# Patient Record
Sex: Female | Born: 1970
Health system: Southern US, Community
[De-identification: ages and names within clinical notes are randomized; demographics above are authoritative.]

## PROBLEM LIST (undated history)

## (undated) DIAGNOSIS — R011 Cardiac murmur, unspecified: Secondary | ICD-10-CM

## (undated) DIAGNOSIS — M47816 Spondylosis without myelopathy or radiculopathy, lumbar region: Secondary | ICD-10-CM

## (undated) DIAGNOSIS — K317 Polyp of stomach and duodenum: Secondary | ICD-10-CM

## (undated) DIAGNOSIS — M519 Unspecified thoracic, thoracolumbar and lumbosacral intervertebral disc disorder: Secondary | ICD-10-CM

## (undated) DIAGNOSIS — T7840XA Allergy, unspecified, initial encounter: Secondary | ICD-10-CM

## (undated) DIAGNOSIS — Q211 Atrial septal defect: Secondary | ICD-10-CM

## (undated) DIAGNOSIS — Q2112 Patent foramen ovale: Secondary | ICD-10-CM

## (undated) DIAGNOSIS — M5104 Intervertebral disc disorders with myelopathy, thoracic region: Secondary | ICD-10-CM

## (undated) DIAGNOSIS — K449 Diaphragmatic hernia without obstruction or gangrene: Secondary | ICD-10-CM

## (undated) DIAGNOSIS — K219 Gastro-esophageal reflux disease without esophagitis: Secondary | ICD-10-CM

## (undated) DIAGNOSIS — K2270 Barrett's esophagus without dysplasia: Secondary | ICD-10-CM

## (undated) HISTORY — PX: MICRODISCECTOMY LUMBAR: SUR864

## (undated) HISTORY — DX: Barrett's esophagus without dysplasia: K22.70

## (undated) HISTORY — DX: Gastro-esophageal reflux disease without esophagitis: K21.9

## (undated) HISTORY — PX: COLONOSCOPY: SHX174

## (undated) HISTORY — PX: TUBAL LIGATION: SHX77

## (undated) HISTORY — DX: Polyp of stomach and duodenum: K31.7

## (undated) HISTORY — DX: Allergy, unspecified, initial encounter: T78.40XA

## (undated) HISTORY — DX: Unspecified thoracic, thoracolumbar and lumbosacral intervertebral disc disorder: M51.9

## (undated) HISTORY — DX: Diaphragmatic hernia without obstruction or gangrene: K44.9

## (undated) HISTORY — DX: Atrial septal defect: Q21.1

## (undated) HISTORY — PX: KNEE ARTHROSCOPY: SUR90

## (undated) HISTORY — DX: Cardiac murmur, unspecified: R01.1

## (undated) HISTORY — DX: Spondylosis without myelopathy or radiculopathy, lumbar region: M47.816

## (undated) HISTORY — PX: UPPER GASTROINTESTINAL ENDOSCOPY: SHX188

## (undated) HISTORY — DX: Patent foramen ovale: Q21.12

---

## 1998-01-10 ENCOUNTER — Other Ambulatory Visit: Admission: RE | Admit: 1998-01-10 | Discharge: 1998-01-10 | Payer: Self-pay | Admitting: Obstetrics and Gynecology

## 1998-07-10 ENCOUNTER — Other Ambulatory Visit: Admission: RE | Admit: 1998-07-10 | Discharge: 1998-07-10 | Payer: Self-pay | Admitting: Obstetrics and Gynecology

## 1999-06-30 ENCOUNTER — Encounter: Payer: Self-pay | Admitting: Family Medicine

## 1999-06-30 ENCOUNTER — Ambulatory Visit (HOSPITAL_COMMUNITY): Admission: RE | Admit: 1999-06-30 | Discharge: 1999-06-30 | Payer: Self-pay | Admitting: Family Medicine

## 1999-07-16 ENCOUNTER — Other Ambulatory Visit: Admission: RE | Admit: 1999-07-16 | Discharge: 1999-07-16 | Payer: Self-pay | Admitting: *Deleted

## 2000-07-27 ENCOUNTER — Other Ambulatory Visit: Admission: RE | Admit: 2000-07-27 | Discharge: 2000-07-27 | Payer: Self-pay | Admitting: *Deleted

## 2001-08-03 ENCOUNTER — Other Ambulatory Visit: Admission: RE | Admit: 2001-08-03 | Discharge: 2001-08-03 | Payer: Self-pay | Admitting: Obstetrics and Gynecology

## 2002-08-15 ENCOUNTER — Other Ambulatory Visit: Admission: RE | Admit: 2002-08-15 | Discharge: 2002-08-15 | Payer: Self-pay | Admitting: Obstetrics and Gynecology

## 2003-07-10 ENCOUNTER — Encounter: Admission: RE | Admit: 2003-07-10 | Discharge: 2003-07-10 | Payer: Self-pay | Admitting: Family Medicine

## 2003-07-24 ENCOUNTER — Encounter: Admission: RE | Admit: 2003-07-24 | Discharge: 2003-07-24 | Payer: Self-pay | Admitting: Family Medicine

## 2003-08-21 ENCOUNTER — Encounter: Admission: RE | Admit: 2003-08-21 | Discharge: 2003-08-21 | Payer: Self-pay | Admitting: Family Medicine

## 2003-08-28 ENCOUNTER — Other Ambulatory Visit: Admission: RE | Admit: 2003-08-28 | Discharge: 2003-08-28 | Payer: Self-pay | Admitting: Obstetrics and Gynecology

## 2003-09-01 HISTORY — PX: HEMILAMINOTOMY LUMBAR SPINE: SUR654

## 2003-09-16 ENCOUNTER — Ambulatory Visit (HOSPITAL_COMMUNITY): Admission: RE | Admit: 2003-09-16 | Discharge: 2003-09-17 | Payer: Self-pay | Admitting: Neurosurgery

## 2005-11-25 ENCOUNTER — Inpatient Hospital Stay (HOSPITAL_COMMUNITY): Admission: AD | Admit: 2005-11-25 | Discharge: 2005-11-28 | Payer: Self-pay | Admitting: Obstetrics and Gynecology

## 2005-11-25 ENCOUNTER — Encounter (INDEPENDENT_AMBULATORY_CARE_PROVIDER_SITE_OTHER): Payer: Self-pay | Admitting: Specialist

## 2005-12-02 ENCOUNTER — Ambulatory Visit: Admission: RE | Admit: 2005-12-02 | Discharge: 2005-12-02 | Payer: Self-pay | Admitting: Obstetrics and Gynecology

## 2007-10-24 ENCOUNTER — Encounter (INDEPENDENT_AMBULATORY_CARE_PROVIDER_SITE_OTHER): Payer: Self-pay | Admitting: Obstetrics and Gynecology

## 2007-10-24 ENCOUNTER — Inpatient Hospital Stay (HOSPITAL_COMMUNITY): Admission: AD | Admit: 2007-10-24 | Discharge: 2007-10-26 | Payer: Self-pay | Admitting: Obstetrics and Gynecology

## 2007-11-01 ENCOUNTER — Inpatient Hospital Stay (HOSPITAL_COMMUNITY): Admission: AD | Admit: 2007-11-01 | Discharge: 2007-11-01 | Payer: Self-pay | Admitting: Obstetrics and Gynecology

## 2008-03-27 ENCOUNTER — Ambulatory Visit (HOSPITAL_COMMUNITY): Admission: RE | Admit: 2008-03-27 | Discharge: 2008-03-27 | Payer: Self-pay | Admitting: Family Medicine

## 2009-07-09 ENCOUNTER — Encounter: Admission: RE | Admit: 2009-07-09 | Discharge: 2009-07-09 | Payer: Self-pay | Admitting: Obstetrics and Gynecology

## 2009-10-16 ENCOUNTER — Ambulatory Visit (HOSPITAL_COMMUNITY): Admission: RE | Admit: 2009-10-16 | Discharge: 2009-10-16 | Payer: Self-pay | Admitting: Family Medicine

## 2010-01-02 ENCOUNTER — Encounter: Admission: RE | Admit: 2010-01-02 | Discharge: 2010-01-02 | Payer: Self-pay | Admitting: Family Medicine

## 2010-05-24 ENCOUNTER — Encounter: Payer: Self-pay | Admitting: Family Medicine

## 2010-06-10 ENCOUNTER — Other Ambulatory Visit: Payer: Self-pay | Admitting: Obstetrics and Gynecology

## 2010-06-10 DIAGNOSIS — Z1239 Encounter for other screening for malignant neoplasm of breast: Secondary | ICD-10-CM

## 2010-07-22 ENCOUNTER — Ambulatory Visit
Admission: RE | Admit: 2010-07-22 | Discharge: 2010-07-22 | Disposition: A | Discharge status: Home / Self Care | Payer: BC Managed Care – PPO | Source: Ambulatory Visit | Attending: Obstetrics and Gynecology | Admitting: Obstetrics and Gynecology

## 2010-07-22 DIAGNOSIS — Z1239 Encounter for other screening for malignant neoplasm of breast: Secondary | ICD-10-CM

## 2010-09-04 ENCOUNTER — Other Ambulatory Visit (HOSPITAL_COMMUNITY): Payer: Self-pay | Admitting: Internal Medicine

## 2010-09-04 ENCOUNTER — Ambulatory Visit (HOSPITAL_COMMUNITY)
Admission: RE | Admit: 2010-09-04 | Discharge: 2010-09-04 | Disposition: A | Discharge status: Home / Self Care | Payer: BC Managed Care – PPO | Source: Ambulatory Visit | Attending: Internal Medicine | Admitting: Internal Medicine

## 2010-09-04 DIAGNOSIS — R51 Headache: Secondary | ICD-10-CM | POA: Insufficient documentation

## 2010-09-04 DIAGNOSIS — R220 Localized swelling, mass and lump, head: Secondary | ICD-10-CM | POA: Insufficient documentation

## 2010-09-04 DIAGNOSIS — J323 Chronic sphenoidal sinusitis: Secondary | ICD-10-CM | POA: Insufficient documentation

## 2010-09-04 MED ORDER — IOHEXOL 300 MG/ML  SOLN
100.0000 mL | Freq: Once | INTRAMUSCULAR | Status: AC | PRN
  Administered 2010-09-04: 100 mL via INTRAVENOUS

## 2010-09-10 ENCOUNTER — Telehealth: Payer: Self-pay | Admitting: *Deleted

## 2010-09-10 NOTE — Telephone Encounter (Signed)
Called patient to ask that she bring [place] records from Dr Marlane Hatcher office (she last saw him 2 years ago) before her upcoming appointment. Patient states that she will be glad to do so. However, she actually needs to reschedule the upcoming appointment to a later date. She will call back to do so since at the current time, there are no new patient availabilities.

## 2010-09-15 NOTE — Op Note (Signed)
NAME:  [name], [name] NO.:  [id number]   MEDICAL RECORD NO.:  [id number]          PATIENT TYPE:  INP   LOCATION:  9104                          FACILITY:  [facility]   PHYSICIAN:  Maxie Better, M.D.DATE OF BIRTH:  Jul 31, 1970   DATE OF PROCEDURE:  10/24/2007  DATE OF DISCHARGE:                               OPERATIVE REPORT   PREOPERATIVE DIAGNOSIS:  Arrest of descent, previous cesarean section  ,failed attempted vaginal delivery, desires permanent sterilization  ,post dates.   PROCEDURE:  Repeat cesarean section, Sharl Ma hysterotomy, and modified  Pomeroy tubal ligation.   POSTOP DIAGNOSIS:  Arrest of descent, previous cesarean section, failed  attempted vaginal delivery, desires permanent sterilization ,post dates.   ANESTHESIA:  Epidural.   SURGEON:  Maxie Better, MD   ASSISTANT:  Marlinda Mike, CNM   PROCEDURE:  Under adequate epidural anesthesia, the patient was placed  in the supine position with a left lateral tilt.  She was sterilely  prepped and draped in usual fashion.  An indwelling Foley catheter was  already in place.  0.25% Marcaine was injected along the previous  Pfannenstiel skin incision.  Pfannenstiel skin incision was then made,  carried down to the rectus fascia.  Rectus fascia was opened  transversely.  The rectus fascia was then bluntly and sharply dissected  off the rectus muscle in a superior and inferior fashion.  The rectus  muscle was split in midline.  The parietal peritoneum was entered  bluntly and extended.  Vesicouterine peritoneum was opened transversely.  The bladder was then bluntly dissected off the lower uterine segment and  displaced with a bladder retractor.  Curvilinear low-transverse uterine  incision was then made and extended with bandage scissors.  Subsequent  delivery of a live female from the left occiput posterior position was  accomplished.  Baby was bulb suctioned on the abdomen and thw  cord was  clamped  and cut.  The baby was transferred to the awaiting pediatrician  who assigned Apgars of 8 and 9 at 1 and 5 minutes.  The placenta which  was anterior was manually removed.  Placenta was  both spontaneous and  manually removed.  Uterine cavity was cleaned of debris.  Uterine  incision had no extension.  It was closed in 2 layers.  The first layer  with 0 Monocryl in running locked stitch.  Second layer was imbricated  using 0 Monocryl suture.  Bleeding site in the middle of the uterine  incision was hemostasis with 0 Monocryl  figure-of-eight stitch.  The  attention was then turned to the fallopian tubes, both of which were  normal.  Both ovaries were normal.  Midportion of both fallopian tubes  were grasped with a Babcock.  The underlying mesosalpinx was opened with  cautery.  The proximal distal portion of the tube was tied with 0  chromic sutures x2 proximally and distally on both tubes and intervening  segments of both tubes were then removed.  The abdomen was then  copiously irrigated and suctioned debris.  The parietal peritoneum was  then closed with 2-0 Vicryl.  The rectus fascia  was closed with 0 Vicryl  x2.  Interrupted 2-0 plain sutures was placed in the subcutaneous area.  4-0 Monocryl subcuticular suture was done for the closure of the skin.   SPECIMENS:  Portion of right and left fallopian tube and placenta all  sent to pathology.   ESTIMATED BLOOD LOSS:  800 mL.   INTRAOPERATIVE FLUID:  1200 mL.   URINE OUTPUT:  200 mL.  There was initially blood noted.  The retrograde  filling of the bladder with  blue colored dyed fuid was done and  showed bladder was intact and subsequently clear yellow urine was noted.  The sponge and instrument counts x2 was correct.   COMPLICATION:  None.   Weight of the baby was 7 pounds 15 ounces.  Patient tolerated the  procedure well and was transferred to recovery room in stable condition.      Maxie Better, M.D.   Electronically Signed     [place]/MEDQ  D:  10/24/2007  T:  10/25/2007  Job:  [id number]

## 2010-09-16 ENCOUNTER — Ambulatory Visit: Payer: BC Managed Care – PPO | Admitting: Internal Medicine

## 2010-09-18 NOTE — Discharge Summary (Signed)
NAMECARTIER, MAPEL NO.:  [id number]   MEDICAL RECORD NO.:  [id number]          PATIENT TYPE:  INP   LOCATION:  9104                          [facility]ACILITY[facility]  [facility]   PHYSICIAN:  Maxie Better, M.D.DATE OF BIRTH:  1970/06/19   DATE OF ADMISSION:  10/24/2007  DATE OF DISCHARGE:  10/26/2007                               DISCHARGE SUMMARY   ADMISSION DIAGNOSES:  1. Postdate.  2. Spontaneous rupture of membranes.  3. Previous cesarean section.  4. Labor.   DISCHARGE DIAGNOSES:  1. Desired sterilization.  2. Postdate delivered.  3. Failed attempted vaginal delivery.  4. Arrest of descent, previous cesarean section.   PROCEDURES:  A repeat cesarean section, Sharl Ma hysterotomy, and modified  Pomeroy tubal ligation.   HISTORY OF PRESENT ILLNESS:  A 40 year old, gravida 2, para 1-0-0-1  female with previous cesarean section presented at 40+ weeks with  spontaneous rupture of membranes in labor.  The patient desired an  attempted vaginal delivery.   HOSPITAL COURSE:  The patient was admitted to the Labor Unit.  She was  4, 80%, -3 with gross rupture of membranes.  Her group B strep was  negative.  The patient had epidural for pain management.  She progressed  to full dilatation and pushed without descent.  Fetal tachycardia was  subsequently noted to be 190s with decreased beat-to-beat variability.  She was contracting every 1 to 1-1/2 minutes.  The patient has not  required any Pitocin.  This was a spontaneous labor.  Maternal  tachycardia was noted.  Despite great pushing efforts, no descent was  noted and repeat cesarean section was recommended.  The patient  expressed desire for permanent sterilization.  She was taken to the  operating room, underwent a repeat cesarean section, modified Pomeroy  tubal ligation, live female, left occiput posterior position, 7 pounds 15  ounces.  Apgars are 8 and 9.  A portion of the right and left fallopian  tube was removed and  confirmed by pathology.  The patient had a  subsequent uncomplicated postoperative course.  She requested early  discharge on postop day #2.  Her CBC on postop day #1 showed a  hemoglobin of 10.8, hematocrit 32.0, white count 8.7, platelet count  155,000.  On postop day #2, incision had no evidence of infection.  She  was deemed well to be discharged home.   DISPOSITION:  Home.   CONDITION:  Stable.   DISCHARGE MEDICATIONS:  1. Darvocet 1-2 tablets every 4-6 hours p.r.n. pain.  2. Motrin 800 mg every 8 hours p.r.n. pain.  3. Prenatal vitamins 1 p.o. daily.   DISCHARGE INSTRUCTIONS:  Per the postpartum booklet given.  Followup  appointment at [facility] in 6 weeks.      Maxie Better, M.D.  Electronically Signed     [place]/MEDQ  D:  11/23/2007  T:  11/23/2007  Job:  [id number]

## 2010-09-18 NOTE — Op Note (Signed)
[name], COURY NO.:  [id number]   MEDICAL RECORD NO.:  [id number]                   PATIENT TYPE:  OIB   LOCATION:  2899                                 FACILITY:  [facility]   PHYSICIAN:  Hewitt Shorts, M.D.            DATE OF BIRTH:  02-03-1971   DATE OF PROCEDURE:  09/16/2003  DATE OF DISCHARGE:                                 OPERATIVE REPORT   PREOPERATIVE DIAGNOSES:  1. Left L5-S1 lumbar disk herniation.  2. Lumbar degenerative disk disease.  3. Lumbar spondylosis.  4. Lumbar radiculopathy.   POSTOPERATIVE DIAGNOSES:  1. Left L5-S1 lumbar disk herniation.  2. Lumbar degenerative disk disease.  3. Lumbar spondylosis.  4. Lumbar radiculopathy.   PROCEDURE:  Left L5-S1 lumbar laminotomy and micro-diskectomy with micro-  dissection.   SURGEON:  Hewitt Shorts, M.D.   ASSISTANT:  Hilda Lias, M.D.   ANESTHESIA:  General endotracheal.   INDICATIONS FOR PROCEDURE:  The patient is a 40 year old woman who presented  with a left lumbar radiculopathy with significant weakness of the left  dorsiflexor and extensor hallices longus.  The decision was made to proceed  with an elective laminotomy and micro-diskectomy.   DESCRIPTION OF PROCEDURE:  The patient is brought to the operating room and  placed under general endotracheal anesthesia.  The patient was turned to the  prone position.  The lumbar region was prepped with Duraprep and draped in a  sterile fashion.  A midline  level of incision was infiltrated with local  anesthetic with epinephrine.  An x-ray was taken at the L5-S1 level,  localized, and then a midline incision was made over the L5-S1 level, being  carried down through the subcutaneous tissue with bipolar cautery.  Electrocautery was used to maintain hemostasis.  Dissection was carried down  to the lumbar fascia which was incised on the left side of the midline of  the paraspinous muscles, with dissection in the  spinous process and lamina  in a subperiosteal fashion.  An x-ray was taken to localize the L5-S1  interlaminar space.  The microscope was draped and brought into the field to  provide magnification, illumination and visualization.  The remainder of the  decompression was performed using micro-dissection and micro-surgical  technique.  A laminotomy was performed of the L5 inferior lamina using the  [facility] Max drill and Kerrison punches.  The ligament of flavum was carefully  resected.  We were able to identify the thecal sac exiting the S1 nerve  roots.  We examined the epidural space.  The epidural veins were coagulated  as necessary.  The exiting L5-S1 neural foramen was identified.  There was a  significant spondylitic disk herniation at the level of the disk space, as  well as extending rostrally above the level of the disk space and laterally  into the L5-S1 neural foramen.  This was removed in a piece-meal fashion.  We incised  the overlying epidural tissues and entered both the disk  herniation that was located rostrally and laterally, as well as into the  disk space.  There was significant spinal overgrowth in the posterior aspect  of L5.  This was removed using an osteophyte removal tool.  The spondylitic  disk protrusion extended laterally into the neural foramen and so as to  decompress the exiting L5 nerve root within the neural foramen, the  spondylosis was carefully removed, as was the disk herniation, decompressing  the left L5 nerve root.  In the end, all loose fragments of disk material  were removed from both the disk space and the epidural space, and a good  decompression of the lateral thecal sac and exiting L5 and S1 nerve roots  was achieved.  Hemostasis was established with the use of Gelfoam soaked in  thrombin.  All the Gelfoam was removed prior to closure.  We did a Valsalva  of the patient, which showed no evidence of CSF leak, and the patient  actually began to  cough with the Valsalva maneuver, and again there was no  evidence of CSF leak.  The wound was irrigated with Bacitracin solution on  numerous occasions throughout the procedure.  Once hemostasis was once again  confirmed, we proceed with a closure.  The deep fascia was closed with  interrupted undyed #1 Vicryl suture, the subcutaneous and subcuticular  closed with interrupted inverted #2-0 and #3-0 undyed Vicryl sutures, and  the skin was reapproximated with Dermabond.  The patient tolerated the procedure well.  The estimated blood loss was 100  mL.  The sponge and needle counts were correct.  Following surgery the  patient was to be turned back to the supine position, reversed from the  anesthetic, extubated and transferred to the recovery room for further care.                                               Hewitt Shorts, M.D.    [place]/MEDQ  D:  09/16/2003  T:  09/16/2003  Job:  [id number]

## 2010-09-18 NOTE — Discharge Summary (Signed)
[name], [name] NO.:  [id number]   MEDICAL RECORD NO.:  [id number]          PATIENT TYPE:  INP   LOCATION:  9136                          [facility]ACILITY[facility]   PHYSICIAN:  Maxie Better, M.D.DATE OF BIRTH:  Aug 01, 1970   DATE OF ADMISSION:  11/25/2005  DATE OF DISCHARGE:  11/28/2005                                 DISCHARGE SUMMARY   ADMISSION DIAGNOSES:  1. Post dates.  2. Early labor.  3. Group B streptococcus culture positive.   DISCHARGE DIAGNOSES:  1. Post dates, delivered.  2. Chorioamnionitis.  3. Arrest of descent.  4. Status post primary cesarean section.   PROCEDURES:  Primary cesarean section.   HISTORY OF PRESENT ILLNESS:  A 40 year old G1, P0 female at 67 and three-  sevenths weeks admitted on November 25, 2005, in early labor.  The patient had  an unremarkable prenatal course.  She was group B strep positive on October 20, 2005.   HOSPITAL COURSE:  The patient was admitted to [facility].  Her exam on  initial presentation was 3, 90, -1.  The patient was started on penicillin  prophylaxis.  The patient ambulated.  She subsequently had artificial  rupture of membranes at 5 cm, 90%, -1 station.  Epidural was given for pain  management subsequently.  The patient developed a temperature of 100  axillary.  During that time, the fetal heart rate baseline was increased to  160, moderate variability, variable decelerations with contractions.  An  internal scalp electrode was placed.  She was started on Unasyn.  The  Pitocin was discontinued and Tylenol was given for her temperature.  The  patient pushed for 3 hours.  She had an asynclitic presentation, +1 station,  with a caput.  Contractions were every 2-3 minutes at that time with no  evidence of progression.  Recommendation for primary cesarean section was  made.  The patient was taken to the operating room.  Clindamycin and  gentamycin was added to her regimen.  Her cesarean section resulted  in the  delivery of a 7-pound 10-ounce female, Apgars of 7 and 9.  Postoperatively,  the patient was continued on her antibiotics until she was afebrile for 24  hours.  She was tolerating a regular diet.  Her CBC on postoperative day #1  showed a hemoglobin 10.8; hematocrit of 32.1; white count of 15,000;  platelet count was 218,000.  By postoperative day #3 the patient had  remained afebrile, incision showed no evidence of infection.  She was deemed  well to be discharged home.   DISPOSITION:  Home.   CONDITION:  Stable.   DISCHARGE MEDICATIONS:  1. Tylox one to two tablets every 3-4 hours p.r.n. pain.  2. Prenatal vitamins one p.o. daily.   FOLLOWUP:  At [facility] at 6 weeks.   DISCHARGE INSTRUCTIONS:  Per the postpartum booklet given.      Maxie Better, M.D.  Electronically Signed     [place]/MEDQ  D:  12/28/2005  T:  12/28/2005  Job:  [id number]

## 2010-09-18 NOTE — Op Note (Signed)
[name], [name] NO.:  [id number]   MEDICAL RECORD NO.:  [id number]          PATIENT TYPE:  INP   LOCATION:  9162                          [facility]   PHYSICIAN:  Maxie Better, M.D.DATE OF BIRTH:  1970/05/04   DATE OF PROCEDURE:  11/25/2005  DATE OF DISCHARGE:                                 OPERATIVE REPORT   PREOPERATIVE DIAGNOSIS:  Arrest of descent, chorioamnionitis.   PROCEDURE:  Primary cesarean section, Kerr hysterotomy.   POSTOPERATIVE DIAGNOSIS:  Arrest of descent, chorioamnionitis.   ANESTHESIA:  Epidural.   SURGEON:  Maxie Better, M.D.   ASSISTANT:  Richardean Sale, M.D.   INDICATIONS:  A 40 year old, G1, P46 female at 68+ weeks gestation, admitted  on November 25, 2005, in early labor, who progressed to full dilatation and  pushed for 3 hours without any further descent below +1 station.  The  patient developed fever during her labor.  She had been group B strep  positive and had been on penicillin.  Recommendation for cesarean section  was made.  Risk of surgery was reviewed.  The patient was transferred to the  operating room.   PROCEDURE:  Under adequate epidural anesthesia, the patient was placed in  the supine position with a left lateral tilt.  She was sterilely prepped and  draped in usual fashion.  Indwelling Foley catheter was already in place.  A  Pfannenstiel skin incision was then made, carried down to the rectus fascia.  Rectus fascia incised in midline, extended bilaterally.  Rectus fascia then  bluntly and sharply dissected off the rectus muscles, in superior and  inferior fashion.  The rectus muscles split in midline.  The parietal  peritoneum was entered sharply and extended.  The vesicouterine peritoneum  was opened transversely.  The bladder was bluntly dissected off the lower  uterine segment and displaced inferiorly using a bladder retractor.  A  curvilinear low transverse uterine incision was then made and  extended  bilaterally using bandage scissors.  Subsequent delivery of a live female,  from a wedged-in position, was accomplished.  The vacuum was used to assist  with the subsequent delivery.  The baby was delivered, bulb suctioned on the  abdomen, cord was clamped, cut, and the baby was transferred to the awaiting  pediatrician who assigned Apgars of 7 and 9 at one and five minutes.  The  placenta was spontaneous intact.  Uterine cavity was cleaned of debris.  Uterine incision had no extension.  It was closed in two layers, the first  layer of 0 Monocryl running locked stitch, second layer imbricating with 0  Monocryl suture.  Normal tubes and ovaries were noted bilaterally.  Abdomen  was copiously irrigated, suctioned of debris with good hemostasis along the  incision.  The parietal peritoneum was closed with 2-0 Vicryl.  The rectus  fascia was closed with 0 Vicryl x2.  The subcutaneous area was irrigated,  small bleeders cauterized, and skin approximated using Ethicon staples.   SPECIMENS:  Placenta sent to pathology.   ESTIMATED BLOOD LOSS:  700 70 mL.   INTRAOPERATIVE FLUID:  800  mL crystalloid.   URINE OUTPUT:  200 mL clear yellow urine.   Sponge and instrument counts times two correct.   COMPLICATION:  None.  The patient tolerated procedure well and was  transferred to recovery in stable condition.  Weight of the baby was 7  pounds 10 ounces.      Maxie Better, M.D.  Electronically Signed     [place]/MEDQ  D:  11/25/2005  T:  11/25/2005  Job:  [id number]

## 2010-11-27 ENCOUNTER — Other Ambulatory Visit: Payer: Self-pay | Admitting: Internal Medicine

## 2010-11-27 DIAGNOSIS — R1012 Left upper quadrant pain: Secondary | ICD-10-CM

## 2010-12-01 ENCOUNTER — Other Ambulatory Visit: Payer: BC Managed Care – PPO

## 2010-12-01 ENCOUNTER — Ambulatory Visit (HOSPITAL_COMMUNITY)
Admission: RE | Admit: 2010-12-01 | Discharge: 2010-12-01 | Disposition: A | Discharge status: Home / Self Care | Payer: BC Managed Care – PPO | Source: Ambulatory Visit | Attending: Internal Medicine | Admitting: Internal Medicine

## 2010-12-01 ENCOUNTER — Encounter (HOSPITAL_COMMUNITY): Payer: Self-pay

## 2010-12-01 DIAGNOSIS — R1012 Left upper quadrant pain: Secondary | ICD-10-CM | POA: Insufficient documentation

## 2010-12-01 HISTORY — DX: Intervertebral disc disorders with myelopathy, thoracic region: M51.04

## 2010-12-04 ENCOUNTER — Other Ambulatory Visit (HOSPITAL_COMMUNITY): Payer: Self-pay | Admitting: Internal Medicine

## 2010-12-04 ENCOUNTER — Ambulatory Visit (HOSPITAL_COMMUNITY)
Admission: RE | Admit: 2010-12-04 | Discharge: 2010-12-04 | Disposition: A | Discharge status: Home / Self Care | Payer: BC Managed Care – PPO | Source: Ambulatory Visit | Attending: Internal Medicine | Admitting: Internal Medicine

## 2010-12-04 DIAGNOSIS — R591 Generalized enlarged lymph nodes: Secondary | ICD-10-CM

## 2010-12-04 DIAGNOSIS — I700 Atherosclerosis of aorta: Secondary | ICD-10-CM | POA: Insufficient documentation

## 2010-12-04 DIAGNOSIS — J984 Other disorders of lung: Secondary | ICD-10-CM | POA: Insufficient documentation

## 2010-12-04 DIAGNOSIS — R599 Enlarged lymph nodes, unspecified: Secondary | ICD-10-CM | POA: Insufficient documentation

## 2010-12-04 MED ORDER — IOHEXOL 300 MG/ML  SOLN
100.0000 mL | Freq: Once | INTRAMUSCULAR | Status: AC | PRN
  Administered 2010-12-04: 100 mL via INTRAVENOUS

## 2011-01-28 LAB — CBC
HCT: 32 — ABNORMAL LOW
HCT: 34.5 — ABNORMAL LOW
MCHC: 33.6
MCHC: 34.3
MCV: 84.4
Platelets: 164
Platelets: 282
RBC: 4.46
RDW: 14.8
WBC: 8.7

## 2011-01-28 LAB — URINALYSIS, ROUTINE W REFLEX MICROSCOPIC
Bilirubin Urine: NEGATIVE
Glucose, UA: NEGATIVE
Ketones, ur: NEGATIVE
Leukocytes, UA: NEGATIVE
Nitrite: NEGATIVE
Protein, ur: NEGATIVE
Specific Gravity, Urine: 1.01
Urobilinogen, UA: 0.2
pH: 8

## 2011-01-28 LAB — COMPREHENSIVE METABOLIC PANEL
ALT: 34
Calcium: 7.8 — ABNORMAL LOW
Glucose, Bld: 97
Sodium: 137
Total Protein: 5.5 — ABNORMAL LOW

## 2011-01-28 LAB — URINE MICROSCOPIC-ADD ON

## 2011-01-28 LAB — RPR: RPR Ser Ql: NONREACTIVE

## 2011-01-28 LAB — DIFFERENTIAL
Eosinophils Absolute: 0
Lymphs Abs: 2.8
Monocytes Relative: 3
Neutro Abs: 2.1
Neutrophils Relative %: 41 — ABNORMAL LOW

## 2011-01-28 LAB — URINE CULTURE
Colony Count: >=100000
Special Requests: NEGATIVE

## 2011-06-22 ENCOUNTER — Other Ambulatory Visit: Payer: Self-pay | Admitting: Obstetrics and Gynecology

## 2011-06-22 DIAGNOSIS — Z1231 Encounter for screening mammogram for malignant neoplasm of breast: Secondary | ICD-10-CM

## 2011-07-27 ENCOUNTER — Ambulatory Visit
Admission: RE | Admit: 2011-07-27 | Discharge: 2011-07-27 | Disposition: A | Discharge status: Home / Self Care | Payer: BC Managed Care – PPO | Source: Ambulatory Visit | Attending: Obstetrics and Gynecology | Admitting: Obstetrics and Gynecology

## 2011-07-27 DIAGNOSIS — Z1231 Encounter for screening mammogram for malignant neoplasm of breast: Secondary | ICD-10-CM

## 2011-09-03 ENCOUNTER — Other Ambulatory Visit: Payer: Self-pay

## 2011-09-03 DIAGNOSIS — I83893 Varicose veins of bilateral lower extremities with other complications: Secondary | ICD-10-CM

## 2011-10-04 ENCOUNTER — Encounter: Payer: BC Managed Care – PPO | Admitting: Vascular Surgery

## 2011-11-22 ENCOUNTER — Encounter: Payer: Self-pay | Admitting: Vascular Surgery

## 2011-11-23 ENCOUNTER — Encounter: Payer: Self-pay | Admitting: Vascular Surgery

## 2011-11-23 ENCOUNTER — Ambulatory Visit (INDEPENDENT_AMBULATORY_CARE_PROVIDER_SITE_OTHER): Payer: BC Managed Care – PPO | Admitting: Vascular Surgery

## 2011-11-23 ENCOUNTER — Encounter (INDEPENDENT_AMBULATORY_CARE_PROVIDER_SITE_OTHER): Payer: BC Managed Care – PPO | Admitting: *Deleted

## 2011-11-23 VITALS — BP 126/60 | HR 70 | Resp 16 | Ht 64.0 in | Wt 125.0 lb

## 2011-11-23 DIAGNOSIS — R609 Edema, unspecified: Secondary | ICD-10-CM

## 2011-11-23 DIAGNOSIS — I781 Nevus, non-neoplastic: Secondary | ICD-10-CM

## 2011-11-23 DIAGNOSIS — I83893 Varicose veins of bilateral lower extremities with other complications: Secondary | ICD-10-CM | POA: Insufficient documentation

## 2011-11-23 NOTE — Progress Notes (Signed)
Subjective:     Patient ID: [name], female   DOB: 01-14-1971, 41 y.o.   MRN: [id number]  HPI this 41 year old female is evaluated today for spider veins and small varicose veins in both lower extremities. She has no history of DVT, thrombophlebitis, stasis ulcers, distal edema, or other complications. She states she does have some aching and staining discomfort sometimes at night. She does not wear elastic compression stockings. She runs marathons. She had previous sclerotherapy with saline performed by Dr. Nicholas Lose about 13 years ago with a good result.   Past Medical History  Diagnosis Date  . Lumbar disc disease   . Lumbar spondylosis   . HNP (herniated nucleus pulposus with myelopathy), thoracic     History  Substance Use Topics  . Smoking status: Not on file  . Smokeless tobacco: Not on file  . Alcohol Use: Not on file    No family history on file.  No Known Allergies  No current outpatient prescriptions on file.  BP 126/60  Pulse 70  Resp 16  Ht 5\' 4"  (1.626 m)  Wt 125 lb (56.7 kg)  BMI 21.46 kg/m2  Body mass index is 21.46 kg/(m^2).           Review of Systems denies chest pain, dyspnea on exertion, PND, orthopnea, hemoptysis, claudication. Completely negative review of systems     Objective:   Physical Exam blood pressure 126/60 heart rate 70 respirations 16 Gen.-alert and oriented x3 in no apparent distress HEENT normal for age Lungs no rhonchi or wheezing Cardiovascular regular rhythm no murmurs carotid pulses 3+ palpable no bruits audible Abdomen soft nontender no palpable masses Musculoskeletal free of  major deformities Skin clear -no rashes Neurologic normal Lower extremities 3+ femoral and dorsalis pedis pulses palpable bilaterally with no edema Both legs have some spider veins particularly in the distal posterior thigh and proximal calf area. There is one prominent reticular vein on the right leg laterally in the posterior thigh. There is  no distal edema or hyperpigmentation ulceration or other bulging varicosities noted.  Today I ordered bilateral venous duplex exam which I reviewed and interpreted. There is no DVT. There is minimal areas of reflux in both great saphenous systems but the caliber of the vein is small bilaterally.     Assessment:     Bilateral spider veins and reticular veins-no evidence of significant reflux and grade or small saphenous system    Plan:     Offered patient sclerotherapy for the spider and reticular veins which she will consider

## 2011-12-24 ENCOUNTER — Other Ambulatory Visit: Payer: Self-pay | Admitting: Obstetrics and Gynecology

## 2011-12-24 DIAGNOSIS — N63 Unspecified lump in unspecified breast: Secondary | ICD-10-CM

## 2011-12-24 DIAGNOSIS — N644 Mastodynia: Secondary | ICD-10-CM

## 2011-12-28 ENCOUNTER — Other Ambulatory Visit: Payer: BC Managed Care – PPO

## 2011-12-29 ENCOUNTER — Ambulatory Visit
Admission: RE | Admit: 2011-12-29 | Discharge: 2011-12-29 | Disposition: A | Discharge status: Home / Self Care | Payer: BC Managed Care – PPO | Source: Ambulatory Visit | Attending: Obstetrics and Gynecology | Admitting: Obstetrics and Gynecology

## 2011-12-29 DIAGNOSIS — N63 Unspecified lump in unspecified breast: Secondary | ICD-10-CM

## 2011-12-29 DIAGNOSIS — N644 Mastodynia: Secondary | ICD-10-CM

## 2012-01-27 ENCOUNTER — Encounter (HOSPITAL_COMMUNITY): Payer: Self-pay | Admitting: Emergency Medicine

## 2012-01-27 ENCOUNTER — Emergency Department (HOSPITAL_COMMUNITY)
Admission: EM | Admit: 2012-01-27 | Discharge: 2012-01-27 | Disposition: A | Discharge status: Home / Self Care | Payer: Worker's Compensation | Attending: Emergency Medicine | Admitting: Emergency Medicine

## 2012-01-27 DIAGNOSIS — Z23 Encounter for immunization: Secondary | ICD-10-CM | POA: Insufficient documentation

## 2012-01-27 DIAGNOSIS — M47817 Spondylosis without myelopathy or radiculopathy, lumbosacral region: Secondary | ICD-10-CM | POA: Insufficient documentation

## 2012-01-27 DIAGNOSIS — Z203 Contact with and (suspected) exposure to rabies: Secondary | ICD-10-CM

## 2012-01-27 MED ORDER — RABIES VACCINE, PCEC IM SUSR
1.0000 mL | Freq: Once | INTRAMUSCULAR | Status: DC
  Filled 2012-01-27: qty 1

## 2012-01-27 MED ORDER — RABIES VIRUS VACCINE, HDC IM INJ
1.0000 mL | INJECTION | Freq: Once | INTRAMUSCULAR | Status: AC
  Administered 2012-01-27: 1 mL via INTRAMUSCULAR
  Filled 2012-01-27: qty 1

## 2012-01-27 MED ORDER — RABIES VIRUS VACCINE, HDC IM INJ: 1.0000 mL | INJECTION | Freq: Once | INTRAMUSCULAR | Status: DC

## 2012-01-27 NOTE — ED Provider Notes (Signed)
Medical screening examination/treatment/procedure(s) were conducted as a shared visit with non-physician practitioner(s) and myself.  I personally evaluated the patient during the encounter  Will not need Immunoglobulin today, will only need updated vaccine day 0 and 3. No symptoms  Lyanne Co, MD 01/27/12 [phone]

## 2012-01-27 NOTE — ED Provider Notes (Signed)
History     CSN: [id number]  Arrival date & time 01/27/12  1017   None     Chief Complaint  Patient presents with  . Rabies Injection    (Consider location/radiation/quality/duration/timing/severity/associated sxs/prior treatment) HPI Comments: [name] 41 y.o. female   The chief complaint is: Patient presents with:   Rabies Injection   The patient has medical history significant for:   Past Medical History:   Lumbar disc disease                                          Lumbar spondylosis                                           HNP (herniated nucleus pulposus with myelopath*              Patient presents s/p rabies exposure at work. Puppy died two weeks ago and autopsy revealed leptospirosis and encephalitis. Subsequent rabies testing was inconclusive. Vet contacted the CDC and was told to bring all staff that had exposure into the ED for vaccination or immunoglobulin depending on prior vaccination. Patient states that she was vaccinated 2 years ago. Her  contact with the puppy included multiple interactions and dealing with airway, tongue, and teeth, when the puppy had a seizure. Denies bite, cuts, or scrapes. Denies fever or chills. Denies NVD or abdominal pain. Denies headache, visual disturbance. Denies agitation or mood swings.     The history is provided by the patient. No language interpreter was used.    Past Medical History  Diagnosis Date  . Lumbar disc disease   . Lumbar spondylosis   . HNP (herniated nucleus pulposus with myelopathy), thoracic     Past Surgical History  Procedure Date  . Cesarean section     x 2  . Hysterotomy   . Tubal ligation     History reviewed. No pertinent family history.  History  Substance Use Topics  . Smoking status: Not on file  . Smokeless tobacco: Not on file  . Alcohol Use: Not on file    OB History    Grav Para Term Preterm Abortions TAB SAB Ect Mult Living                  Review of Systems    Constitutional: Negative for fever and chills.  Eyes: Negative for visual disturbance.  Gastrointestinal: Negative for nausea, vomiting, abdominal pain and diarrhea.  Neurological: Negative for headaches.  All other systems reviewed and are negative.    Allergies  Review of patient's allergies indicates no known allergies.  Home Medications   Current Outpatient Rx  Name Route Sig Dispense Refill  . BIOTIN PO Oral Take 1 capsule by mouth daily.    Marland Kitchen CALCIUM CARBONATE-VITAMIN D 500-200 MG-UNIT PO TABS Oral Take 1 tablet by mouth 2 (two) times daily.    Marland Kitchen DOXYCYCLINE HYCLATE 100 MG PO CAPS Oral Take 100 mg by mouth 2 (two) times daily.    . OMEGA-3 FATTY ACIDS 1000 MG PO CAPS Oral Take 1 g by mouth daily.    . GUAIFENESIN ER 600 MG PO TB12 Oral Take 600 mg by mouth 2 (two) times daily as needed. For cough.    . ADULT MULTIVITAMIN W/MINERALS CH Oral  Take 1 tablet by mouth daily.      BP 115/60  Pulse 63  Temp 98.5 F (36.9 C) (Oral)  Resp 18  Ht 5\' 7"  (1.702 m)  Wt 125 lb (56.7 kg)  BMI 19.58 kg/m2  SpO2 100%  LMP 12/08/2011  Physical Exam  Nursing note and vitals reviewed. Constitutional: She appears well-developed and well-nourished. No distress.  HENT:  Head: Normocephalic and atraumatic.  Mouth/Throat: Oropharynx is clear and moist.  Eyes: Conjunctivae normal and EOM are normal. Pupils are equal, round, and reactive to light. No scleral icterus.  Neck: Normal range of motion. Neck supple.  Cardiovascular: Normal rate, regular rhythm and normal heart sounds.   Pulmonary/Chest: Effort normal and breath sounds normal.  Abdominal: Soft. Bowel sounds are normal. There is no tenderness.  Musculoskeletal: Normal range of motion.  Neurological: She is alert. No cranial nerve deficit. She exhibits normal muscle tone. Coordination normal.       Cranial nerves II-XII grossly intact. Good finger to nose. No pronator drift. Negative romberg.  Skin: Skin is warm and dry.    ED  Course  Procedures (including critical care time)  Labs Reviewed - No data to display No results found.   1. 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 She was vaccinated two years ago and therefore received the rabies vaccine only. She will follow-up on day three to receive an additional vaccination. Return precautions given verbally and in discharge summary.        Pixie Casino, PA-C 01/27/12 1317

## 2012-01-27 NOTE — ED Notes (Addendum)
Pt was exposed to possibly rabid dog 9/13.  Pt has had rabies vaccine in the past, needs booster.

## 2012-01-30 ENCOUNTER — Emergency Department (HOSPITAL_COMMUNITY)
Admission: EM | Admit: 2012-01-30 | Discharge: 2012-01-30 | Disposition: A | Discharge status: Home / Self Care | Payer: Worker's Compensation | Source: Home / Self Care

## 2012-01-30 ENCOUNTER — Encounter (HOSPITAL_COMMUNITY): Payer: Self-pay

## 2012-01-30 MED ORDER — RABIES VACCINE, PCEC IM SUSR
1.0000 mL | Freq: Once | INTRAMUSCULAR | Status: AC
  Administered 2012-01-30: 1 mL via INTRAMUSCULAR

## 2012-01-30 MED ORDER — RABIES VACCINE, PCEC IM SUSR
INTRAMUSCULAR | Status: AC
  Filled 2012-01-30: qty 1

## 2012-01-30 NOTE — ED Notes (Signed)
Reported exposure to rabies; here for day #3 shot in series

## 2012-04-04 ENCOUNTER — Encounter: Payer: Self-pay | Admitting: *Deleted

## 2012-04-05 ENCOUNTER — Ambulatory Visit (INDEPENDENT_AMBULATORY_CARE_PROVIDER_SITE_OTHER): Payer: BC Managed Care – PPO | Admitting: *Deleted

## 2012-04-05 DIAGNOSIS — I781 Nevus, non-neoplastic: Secondary | ICD-10-CM

## 2012-04-05 NOTE — Progress Notes (Signed)
X=.3% Sotradecol administered with a 27g butterfly.  Patient received a total of 6cc.  Treated majority of her spider veins with one syringe. Tol well. Easy access. Antic good results. Will follow prn.  Photos: yes  Compression stockings applied: yes

## 2012-07-05 ENCOUNTER — Other Ambulatory Visit: Payer: Self-pay

## 2012-07-05 DIAGNOSIS — Z1231 Encounter for screening mammogram for malignant neoplasm of breast: Secondary | ICD-10-CM

## 2012-08-02 ENCOUNTER — Ambulatory Visit
Admission: RE | Admit: 2012-08-02 | Discharge: 2012-08-02 | Disposition: A | Discharge status: Home / Self Care | Payer: BC Managed Care – PPO | Source: Ambulatory Visit

## 2012-08-02 DIAGNOSIS — Z1231 Encounter for screening mammogram for malignant neoplasm of breast: Secondary | ICD-10-CM

## 2013-02-23 ENCOUNTER — Other Ambulatory Visit (HOSPITAL_COMMUNITY): Payer: Self-pay | Admitting: Internal Medicine

## 2013-02-23 DIAGNOSIS — R102 Pelvic and perineal pain: Secondary | ICD-10-CM

## 2013-02-23 DIAGNOSIS — R109 Unspecified abdominal pain: Secondary | ICD-10-CM

## 2013-02-26 ENCOUNTER — Ambulatory Visit (HOSPITAL_COMMUNITY)
Admission: RE | Admit: 2013-02-26 | Discharge: 2013-02-26 | Disposition: A | Discharge status: Home / Self Care | Payer: 59 | Source: Ambulatory Visit | Attending: Internal Medicine | Admitting: Internal Medicine

## 2013-02-26 DIAGNOSIS — R1032 Left lower quadrant pain: Secondary | ICD-10-CM | POA: Insufficient documentation

## 2013-02-26 DIAGNOSIS — R109 Unspecified abdominal pain: Secondary | ICD-10-CM

## 2013-02-26 DIAGNOSIS — R102 Pelvic and perineal pain: Secondary | ICD-10-CM

## 2013-02-26 DIAGNOSIS — N839 Noninflammatory disorder of ovary, fallopian tube and broad ligament, unspecified: Secondary | ICD-10-CM | POA: Insufficient documentation

## 2013-02-26 MED ORDER — IOHEXOL 300 MG/ML  SOLN
100.0000 mL | Freq: Once | INTRAMUSCULAR | Status: AC | PRN
  Administered 2013-02-26: 100 mL via INTRAVENOUS

## 2013-02-28 ENCOUNTER — Encounter: Payer: Self-pay | Admitting: Gastroenterology

## 2013-03-08 ENCOUNTER — Encounter: Payer: Self-pay | Admitting: Gastroenterology

## 2013-03-08 ENCOUNTER — Ambulatory Visit (INDEPENDENT_AMBULATORY_CARE_PROVIDER_SITE_OTHER): Payer: 59 | Admitting: Gastroenterology

## 2013-03-08 VITALS — BP 112/76 | HR 68 | Ht 63.25 in | Wt 128.2 lb

## 2013-03-08 DIAGNOSIS — R1032 Left lower quadrant pain: Secondary | ICD-10-CM

## 2013-03-08 DIAGNOSIS — N83209 Unspecified ovarian cyst, unspecified side: Secondary | ICD-10-CM

## 2013-03-08 MED ORDER — SOD PICOSULFATE-MAG OX-CIT ACD 10-3.5-12 MG-GM-GM PO PACK: 1.0000 | PACK | ORAL | Status: DC

## 2013-03-08 MED ORDER — HYOSCYAMINE SULFATE 0.125 MG SL SUBL: SUBLINGUAL_TABLET | SUBLINGUAL | Status: DC

## 2013-03-08 NOTE — Patient Instructions (Addendum)
You have been scheduled for a colonoscopy with propofol. Please follow written instructions given to you at your visit today.  Please pick up your prep kit at the pharmacy within the next 1-3 days. If you use inhalers (even only as needed), please bring them with you on the day of your procedure. Your physician has requested that you go to www.startemmi.com and enter the access code given to you at your visit today. This web site gives a general overview about your procedure. However, you should still follow specific instructions given to you by our office regarding your preparation for the procedure.  We have sent the following medications to your pharmacy for you to pick up at your convenience: Levsin.  Thank you for choosing me and [facility].  Venita Lick. Pleas Koch., MD., Clementeen Graham  cc: Geoffry Paradise, MD, Maxie Better, MD

## 2013-03-08 NOTE — Progress Notes (Signed)
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 In 2012 she developed LUQ pain that resolved. CT at that time was negative. LLQ pain developed several weeks ago associated with left back pain and she was found to have a left ovarian cyst. She has been evaluated by her gynecologist. She states her left lower quadrant pain has improved about 90% but she is left with a residual constant dull ache and slight fullness of her left lower quadrant. No change in symptoms with movement, bending, meals, bowel movements. She previously underwent upper endoscopy by Dr. Ewing Schlein in November 2010 with findings of a small hiatal hernia and benign fundic polyps. Denies weight loss, constipation, diarrhea, change in stool caliber, melena, hematochezia, nausea, vomiting, dysphagia, reflux symptoms, chest pain.  Abd/pelvic CT 02/23/13 IMPRESSION:  1. Left adnexal low-attenuation lesion, favored to represent an ovarian cyst. This measures 4.1 cm maximum dimension. Further evaluation with pelvic ultrasound may be helpful given the patient's symptoms.  2. No evidence for free air, abscess, or other mass.  No Known Allergies Outpatient Prescriptions Prior to Visit  Medication Sig Dispense Refill  . BIOTIN PO Take 1 capsule by mouth daily.      . calcium-vitamin D (OSCAL WITH D) 500-200 MG-UNIT per tablet Take 1 tablet by mouth 2 (two) times daily.      . fish oil-omega-3 fatty acids 1000 MG capsule Take 1 g by mouth daily.      Marland Kitchen guaiFENesin (MUCINEX) 600 MG 12 hr tablet Take 600 mg by mouth 2 (two) times daily as needed. For cough.      . Multiple Vitamin (MULTIVITAMIN WITH MINERALS) TABS Take 1 tablet by mouth daily.      Marland Kitchen doxycycline (VIBRAMYCIN) 100 MG capsule Take 100 mg by mouth 2 (two) times daily.       No facility-administered medications prior to visit.   Past Medical History  Diagnosis Date  . Lumbar disc disease   . Lumbar spondylosis   . HNP  (herniated nucleus pulposus with myelopathy), thoracic    Past Surgical History  Procedure Laterality Date  . Cesarean section  2007, 2009    x 2  . Tubal ligation    . Microdiscectomy lumbar      L5-S1  . Hemilaminotomy lumbar spine  09/2003   History   Social History  . Marital Status: Married    Spouse Name: N/A    Number of Children: 2  . Years of Education: N/A   Occupational History  . veternarian    Social History Main Topics  . Smoking status: Never Smoker   . Smokeless tobacco: Never Used  . Alcohol Use: Yes     Comment: 1-2 per month  . Drug Use: No  . Sexual Activity: None   Other Topics Concern  . None   Social History Narrative  . None   Family History  Problem Relation Age of Onset  . Hypertension Father   . Leukemia Mother   . Breast cancer Maternal Aunt   . Heart disease Father    Review of Systems: Pertinent positive and negative review of systems were noted in the above HPI section. All other review of systems were otherwise negative.  Physical Exam: General: Well developed , well nourished, no acute distress Head: Normocephalic and atraumatic Eyes:  sclerae anicteric, EOMI Ears: Normal auditory acuity Mouth: No deformity or lesions Neck: Supple, no masses or thyromegaly Lungs: Clear  throughout to auscultation Heart: Regular rate and rhythm; no murmurs, rubs or bruits Abdomen: Soft, non tender and non distended. No masses, hepatosplenomegaly or hernias noted. Normal Bowel sounds Rectal: no lesions or tenderness noted, heme negative stool Musculoskeletal: Symmetrical with no gross deformities  Skin: No lesions on visible extremities Pulses:  Normal pulses noted Extremities: No clubbing, cyanosis, edema or deformities noted Neurological: Alert oriented x 4, grossly nonfocal Cervical Nodes:  No significant cervical adenopathy Inguinal Nodes: No significant inguinal adenopathy Psychological:  Alert and cooperative. Normal mood and  affect  Assessment and Recommendations:  1. LLQ abdominal pain, substantially improved. Her left ovarian cyst could be the etiology as her pain does not have any associated gastrointestinal symptoms. Trial of hyoscyamine 1-2 4qh prn and assess response. Schedule colonoscopy for further evaluation. The risks, benefits, and alternatives to colonoscopy with possible biopsy and possible polypectomy were discussed with the patient and they consent to proceed.

## 2013-03-09 ENCOUNTER — Encounter: Payer: Self-pay | Admitting: Gastroenterology

## 2013-03-28 ENCOUNTER — Ambulatory Visit: Payer: Self-pay | Admitting: Internal Medicine

## 2013-04-09 ENCOUNTER — Encounter: Payer: Self-pay | Admitting: Gastroenterology

## 2013-05-22 ENCOUNTER — Encounter: Payer: Self-pay | Admitting: Gastroenterology

## 2013-05-23 ENCOUNTER — Other Ambulatory Visit: Payer: Self-pay

## 2013-05-23 DIAGNOSIS — Z1231 Encounter for screening mammogram for malignant neoplasm of breast: Secondary | ICD-10-CM

## 2013-08-07 ENCOUNTER — Ambulatory Visit
Admission: RE | Admit: 2013-08-07 | Discharge: 2013-08-07 | Disposition: A | Discharge status: Home / Self Care | Payer: 59 | Source: Ambulatory Visit

## 2013-08-07 DIAGNOSIS — Z1231 Encounter for screening mammogram for malignant neoplasm of breast: Secondary | ICD-10-CM

## 2014-07-03 ENCOUNTER — Other Ambulatory Visit: Payer: Self-pay

## 2014-07-03 DIAGNOSIS — Z1231 Encounter for screening mammogram for malignant neoplasm of breast: Secondary | ICD-10-CM

## 2014-08-12 ENCOUNTER — Ambulatory Visit
Admission: RE | Admit: 2014-08-12 | Discharge: 2014-08-12 | Disposition: A | Discharge status: Home / Self Care | Payer: 59 | Source: Ambulatory Visit

## 2014-08-12 DIAGNOSIS — Z1231 Encounter for screening mammogram for malignant neoplasm of breast: Secondary | ICD-10-CM

## 2015-06-05 ENCOUNTER — Encounter: Payer: Self-pay | Admitting: Sports Medicine

## 2015-06-11 ENCOUNTER — Encounter: Payer: Self-pay | Admitting: Sports Medicine

## 2015-06-11 ENCOUNTER — Ambulatory Visit (INDEPENDENT_AMBULATORY_CARE_PROVIDER_SITE_OTHER): Payer: 59 | Admitting: Sports Medicine

## 2015-06-11 VITALS — BP 125/59 | Ht 64.0 in | Wt 128.0 lb

## 2015-06-11 DIAGNOSIS — M25561 Pain in right knee: Secondary | ICD-10-CM

## 2015-06-11 NOTE — Progress Notes (Signed)
Subjective:    Patient ID: [name], female    DOB: October 26, 1970, 45 y.o.   MRN: [id number]  HPI chief complaint: Right knee pain and swelling  Very pleasant 45 year old female comes in today complaining of intermittent right knee pain and swelling. Her symptoms all began after she slipped on some ice over a year ago. She suffered a twisting injury to the right knee. She had immediate pain and swelling. She self treated herself with over-the-counter anti-inflammatories, compression, and ice. When her symptoms did not improve, she was seen by Dr. Layne Benton at [place]. At the time of the initial evaluation by Dr. Layne Benton, there was concern for a tibial stress fracture so an MRI of the right lower leg was performed which was unremarkable. She was treated in a Cam Walker while awaiting work-up on her lower leg and as a result of the Cam Walker, she began to develop returning right knee pain and swelling. An MRI of the right knee was then ordered and performed which is available for review. The MRI was read as unremarkable. The patient then returned to Dr. Layne Benton and had a cortisone injection done which did help her for a period of time. The patient is an avid runner, but due to her pain and swelling as well as some personal issues (her husband was being treated for cancer), she was unable to do any high mileage running. Her symptoms resolved through the rest of last year up until Thanksgiving when she began to get pain and swelling again. She once again self treated herself and her symptoms improved but she is currently training for a marathon and, while on a 16 mile run 3 weeks ago, her symptoms returned. She has grown frustrated with her discomfort. She's been unable to return to the level activity that she would like. She met in consultation with Dr. Noemi Chapel to discuss a diagnostic arthroscopy and she comes today for my opinion as to whether or not I think continued nonoperative treatment  would be helpful.  Past medical history reviewed Medications reviewed Allergies reviewed She is a Animal nutritionist    Review of Systems    as above Objective:   Physical Exam Well-developed, well-nourished. Fit-appearing. No acute distress. Awake alert and oriented 3. Vital signs reviewed  Right knee: Full range of motion. No palpable effusion. 2+ patellofemoral crepitus. Good VMO strength. She is tender to palpation along the medial and lateral joint lines. She has a negative McMurray's but a positive Thessaly's. Knee is stable to valgus and varus stressing. Negative anterior drawer. Negative posterior drawer. Neurovascularly intact distally. Normal gait.  X-rays of her right knee as well as an MRI of her right knee from 2016 are reviewed. The studies are unremarkable. An MRI of her right tib-fib is also unremarkable.  Limited MSK ultrasound of the right knee was performed. Patient has a small knee effusion. This was compared to her left knee where no effusion was seen.       Assessment & Plan:  Chronic right knee pain and swelling worrisome for occult meniscal tear  I had a long talk with the patient regarding her knee predicament. We discussed orthotics but she has had custom orthotics for a ruptured plantar fascia in the past and she did not like them. I'm not optimistic that a simple orthotic will help her. 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 All of her symptoms started with a traumatic event in February of last year. Her ultrasound today shows a knee effusion  which does suggest some sort of intra-articular process even though the MRI was unremarkable. I discussed continuing with conservative treatment but she would like to have a definitive answer. I certainly think that it is reasonable for her to consider a diagnostic arthroscopy specifically to evaluate for a possible occult meniscal tear. Patient will follow-up with Dr. Noemi Chapel to discuss this further. Follow-up with me as  needed.  Total time spent with the patient was 30 minutes with greater than 50% of the time spent in face-to-face consultation discussing her right knee pain and reviewing her x-rays, MRI, and ultrasound findings.

## 2015-06-12 ENCOUNTER — Encounter: Payer: Self-pay | Admitting: Sports Medicine

## 2015-07-08 ENCOUNTER — Other Ambulatory Visit: Payer: Self-pay

## 2015-07-08 DIAGNOSIS — Z1231 Encounter for screening mammogram for malignant neoplasm of breast: Secondary | ICD-10-CM

## 2015-08-14 ENCOUNTER — Ambulatory Visit
Admission: RE | Admit: 2015-08-14 | Discharge: 2015-08-14 | Disposition: A | Discharge status: Home / Self Care | Payer: 59 | Source: Ambulatory Visit

## 2015-08-14 DIAGNOSIS — Z1231 Encounter for screening mammogram for malignant neoplasm of breast: Secondary | ICD-10-CM

## 2015-12-15 ENCOUNTER — Encounter: Payer: Self-pay | Admitting: Gastroenterology

## 2015-12-15 ENCOUNTER — Ambulatory Visit (INDEPENDENT_AMBULATORY_CARE_PROVIDER_SITE_OTHER): Payer: 59 | Admitting: Gastroenterology

## 2015-12-15 VITALS — BP 120/80 | HR 72 | Ht 63.25 in | Wt 133.8 lb

## 2015-12-15 DIAGNOSIS — K2270 Barrett's esophagus without dysplasia: Secondary | ICD-10-CM | POA: Diagnosis not present

## 2015-12-15 DIAGNOSIS — K921 Melena: Secondary | ICD-10-CM | POA: Diagnosis not present

## 2015-12-15 DIAGNOSIS — K200 Eosinophilic esophagitis: Secondary | ICD-10-CM

## 2015-12-15 MED ORDER — NA SULFATE-K SULFATE-MG SULF 17.5-3.13-1.6 GM/177ML PO SOLN: 1.0000 | Freq: Once | ORAL | 0 refills | Status: AC

## 2015-12-15 NOTE — Patient Instructions (Signed)

## 2015-12-15 NOTE — Progress Notes (Signed)
    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 She had one episode of hematochezia several weeks ago when she made the appointment and no recurrence since. She had rectal discomfort a swollen anal area lesion that she presumed was an external hemorrhoid that she treated with Preparation H cream and her symptoms have gradually improved over the past several weeks. She was evaluated by Dr. Juanita Craver and January and February 2017 for left upper quadrant pain. She states she had difficulty getting a timely appointment with my office and she was concerned about her LUQ pain so she scheduled with Dr. Collene Mares. She underwent EGD which revealed a medium-sized hiatal hernia, mucosal changes suggestive of eosinophilic esophagitis and mucosal changes suggestive of short segment Barrett's esophagus. Biopsies confirmed eosinophilic esophagitis and Barrett's esophagus without dysplasia. She was placed on omeprazole and Flovent. Her left upper quadrant pain resolved. She has no ongoing reflux symptoms. Denies weight loss, abdominal pain, constipation, diarrhea, change in stool caliber, melena, nausea, vomiting, dysphagia, reflux symptoms, chest pain.  Current Medications, Allergies, Past Medical History, Past Surgical History, Family History and Social History were reviewed in [employer] record.  Physical Exam: General: Well developed, well nourished, no acute distress Head: Normocephalic and atraumatic Eyes:  sclerae anicteric, EOMI Ears: Normal auditory acuity Mouth: No deformity or lesions Lungs: Clear throughout to auscultation Heart: Regular rate and rhythm; no murmurs, rubs or bruits Abdomen: Soft, non tender and non distended. No masses, hepatosplenomegaly or hernias noted. Normal Bowel sounds Rectal: small, pea sized, external hemorrhoid and small external hemorrhoidal tags, no internal lesions, trace of  stool in the vault was heme negative  Musculoskeletal: Symmetrical with no gross deformities  Pulses:  Normal pulses noted Extremities: No clubbing, cyanosis, edema or deformities noted Neurological: Alert oriented x 4, grossly nonfocal Psychological:  Alert and cooperative. Normal mood and affect  Assessment and Recommendations:  1. GERD, Barrett's, eosinophilic esophagitis. Continue omeprazole 20 mg daily long term and Flovent 2 puffs bid, swallowed. Continue standard antireflux measures. Discussed elimination diets for common causes of eosinophilic esophagitis. She is not interested at this time. She asked about surgical repair of her hiatal hernia. Advised her that standard management is acid reducing medications,  and if reflux symptoms can be adequately controlled and there are no other complications from the hiatal hernia, surgery is not generally recommended. In addition if surgery were needed for management of Barrett's with dysplasia or adenocarcinoma in the future a hiatal hernia repair could complicate an esophageal resection. Surveillance EGD had been recommended at 2 years by Dr. Collene Mares and we will continue with that surveillance interval. She was advised that Barrett's esophagus can have a wide range of surveillance intervals however typically for short segment Barrett's esophagus without dysplasia it would be a 3-5 year interval.  2.  Hematochezia. Suspected hemorrhoidal bleeding. R/O IBD, colorectal neoplasms. Schedule colonoscopy. The risks (including bleeding, perforation, infection, missed lesions, medication reactions and possible hospitalization or surgery if complications occur), benefits, and alternatives to colonoscopy with possible biopsy and possible polypectomy were discussed with the patient and they consent to proceed.

## 2015-12-31 ENCOUNTER — Encounter: Payer: Self-pay | Admitting: Gastroenterology

## 2016-01-14 ENCOUNTER — Encounter: Payer: Self-pay | Admitting: Gastroenterology

## 2016-01-14 ENCOUNTER — Ambulatory Visit (AMBULATORY_SURGERY_CENTER): Payer: 59 | Admitting: Gastroenterology

## 2016-01-14 VITALS — BP 103/60 | HR 60 | Temp 98.9°F | Resp 18 | Ht 63.25 in | Wt 133.0 lb

## 2016-01-14 DIAGNOSIS — D120 Benign neoplasm of cecum: Secondary | ICD-10-CM | POA: Diagnosis present

## 2016-01-14 DIAGNOSIS — K921 Melena: Secondary | ICD-10-CM | POA: Diagnosis not present

## 2016-01-14 MED ORDER — SODIUM CHLORIDE 0.9 % IV SOLN: 500.0000 mL | INTRAVENOUS | Status: DC

## 2016-01-14 NOTE — Progress Notes (Signed)
Called to room to assist during endoscopic procedure.  Patient ID and intended procedure confirmed with present staff. Received instructions for my participation in the procedure from the performing physician.  

## 2016-01-14 NOTE — Patient Instructions (Signed)
YOU HAD AN ENDOSCOPIC PROCEDURE TODAY AT THE [facility]:   Refer to the procedure report that was given to you for any specific questions about what was found during the examination.  If the procedure report does not answer your questions, please call your gastroenterologist to clarify.  If you requested that your care partner not be given the details of your procedure findings, then the procedure report has been included in a sealed envelope for you to review at your convenience later.  YOU SHOULD EXPECT: Some feelings of bloating in the abdomen. Passage of more gas than usual.  Walking can help get rid of the air that was put into your GI tract during the procedure and reduce the bloating. If you had a lower endoscopy (such as a colonoscopy or flexible sigmoidoscopy) you may notice spotting of blood in your stool or on the toilet paper. If you underwent a bowel prep for your procedure, you may not have a normal bowel movement for a few days.  Please Note:  You might notice some irritation and congestion in your nose or some drainage.  This is from the oxygen used during your procedure.  There is no need for concern and it should clear up in a day or so.  SYMPTOMS TO REPORT IMMEDIATELY:   Following lower endoscopy (colonoscopy or flexible sigmoidoscopy):  Excessive amounts of blood in the stool  Significant tenderness or worsening of abdominal pains  Swelling of the abdomen that is new, acute  Fever of 100F or higher  For urgent or emergent issues, a gastroenterologist can be reached at any hour by calling [phone].   DIET:  We do recommend a small meal at first, but then you may proceed to your regular diet.  Drink plenty of fluids but you should avoid alcoholic beverages for 24 hours.  ACTIVITY:  You should plan to take it easy for the rest of today and you should NOT DRIVE or use heavy machinery until tomorrow (because of the sedation medicines used during the test).     FOLLOW UP: Our staff will call the number listed on your records the next business day following your procedure to check on you and address any questions or concerns that you may have regarding the information given to you following your procedure. If we do not reach you, we will leave a message.  However, if you are feeling well and you are not experiencing any problems, there is no need to return our call.  We will assume that you have returned to your regular daily activities without incident.  If any biopsies were taken you will be contacted by phone or by letter within the next 1-3 weeks.  Please call us at [phone] if you have not heard about the biopsies in 3 weeks.    SIGNATURES/CONFIDENTIALITY: You and/or your care partner have signed paperwork which will be entered into your electronic medical record.  These signatures attest to the fact that that the information above on your After Visit Summary has been reviewed and is understood.  Full responsibility of the confidentiality of this discharge information lies with you and/or your care-partner. 

## 2016-01-14 NOTE — Op Note (Signed)
[place] Patient Name: [name] Procedure Date: 01/14/2016 9:03 AM MRN: [id number] Endoscopist: Ladene Artist , MD Age: 45 Referring MD:  Date of Birth: [birth year] Gender: Female Account #: [id number] Procedure:                Colonoscopy Indications:              Hematochezia Medicines:                Monitored Anesthesia Care Procedure:                Pre-Anesthesia Assessment:                           - Prior to the procedure, a History and Physical                            was performed, and patient medications and                            allergies were reviewed. The patient's tolerance of                            previous anesthesia was also reviewed. The risks                            and benefits of the procedure and the sedation                            options and risks were discussed with the patient.                            All questions were answered, and informed consent                            was obtained. Prior Anticoagulants: The patient has                            taken no previous anticoagulant or antiplatelet                            agents. ASA Grade Assessment: II - A patient with                            mild systemic disease. After reviewing the risks                            and benefits, the patient was deemed in                            satisfactory condition to undergo the procedure.                           After obtaining informed consent, the colonoscope  was passed under direct vision. Throughout the                            procedure, the patient's blood pressure, pulse, and                            oxygen saturations were monitored continuously. The                            Model PCF-[id number]) scope was introduced                            through the anus and advanced to the the cecum,                            identified by appendiceal orifice and  ileocecal                            valve. The ileocecal valve, appendiceal orifice,                            and rectum were photographed. The quality of the                            bowel preparation was excellent. The colonoscopy                            was performed without difficulty. The patient                            tolerated the procedure well. Scope In: 9:14:43 AM Scope Out: 9:29:56 AM Scope Withdrawal Time: 0 hours 11 minutes 36 seconds  Total Procedure Duration: 0 hours 15 minutes 13 seconds  Findings:                 Two sessile polyps were found in the cecum. The                            polyps were 4 to 5 mm in size.                           Internal hemorrhoids were found during                            retroflexion. The hemorrhoids were small and Grade                            I (internal hemorrhoids that do not prolapse).                           The exam was otherwise without abnormality on                            direct and retroflexion views. Complications:  No immediate complications. Estimated blood loss:                            None. Estimated Blood Loss:     Estimated blood loss: none. Impression:               - Two 4 to 5 mm polyps in the cecum.                           - Internal hemorrhoids.                           - The examination was otherwise normal on direct                            and retroflexion views.                           - No specimens collected. Recommendation:           - Repeat colonoscopy in 5 years for surveillance if                            polyp(s) are precancerous, otherwise 10 years for                            routine screening.                           - Patient has a contact number available for                            emergencies. The signs and symptoms of potential                            delayed complications were discussed with the                            patient. Return  to normal activities tomorrow.                            Written discharge instructions were provided to the                            patient.                           - Resume previous diet.                           - Continue present medications.                           - Await pathology results. Ladene Artist, MD 01/14/2016 9:35:44 AM This report has been signed electronically.

## 2016-01-14 NOTE — Progress Notes (Signed)
Report to PACU, RN, vss, BBS= Clear.  

## 2016-01-15 ENCOUNTER — Telehealth: Payer: Self-pay | Admitting: *Deleted

## 2016-01-15 NOTE — Telephone Encounter (Signed)
No answer. Number identifier. Message left to call if questions or concerns. 

## 2016-01-23 ENCOUNTER — Encounter: Payer: Self-pay | Admitting: Gastroenterology

## 2016-06-03 DIAGNOSIS — J029 Acute pharyngitis, unspecified: Secondary | ICD-10-CM | POA: Diagnosis not present

## 2016-06-30 ENCOUNTER — Other Ambulatory Visit: Payer: Self-pay | Admitting: Obstetrics and Gynecology

## 2016-06-30 DIAGNOSIS — Z1231 Encounter for screening mammogram for malignant neoplasm of breast: Secondary | ICD-10-CM

## 2016-07-14 DIAGNOSIS — Z6821 Body mass index (BMI) 21.0-21.9, adult: Secondary | ICD-10-CM | POA: Diagnosis not present

## 2016-07-14 DIAGNOSIS — Z01419 Encounter for gynecological examination (general) (routine) without abnormal findings: Secondary | ICD-10-CM | POA: Diagnosis not present

## 2016-07-14 DIAGNOSIS — N912 Amenorrhea, unspecified: Secondary | ICD-10-CM | POA: Diagnosis not present

## 2016-07-14 DIAGNOSIS — N911 Secondary amenorrhea: Secondary | ICD-10-CM | POA: Diagnosis not present

## 2016-07-15 ENCOUNTER — Other Ambulatory Visit: Payer: Self-pay | Admitting: Obstetrics and Gynecology

## 2016-07-15 DIAGNOSIS — R9389 Abnormal findings on diagnostic imaging of other specified body structures: Secondary | ICD-10-CM

## 2016-07-20 ENCOUNTER — Encounter: Payer: Self-pay | Admitting: Radiology

## 2016-07-20 ENCOUNTER — Ambulatory Visit
Admission: RE | Admit: 2016-07-20 | Discharge: 2016-07-20 | Disposition: A | Discharge status: Home / Self Care | Payer: 59 | Source: Ambulatory Visit | Attending: Obstetrics and Gynecology | Admitting: Obstetrics and Gynecology

## 2016-07-20 DIAGNOSIS — R9389 Abnormal findings on diagnostic imaging of other specified body structures: Secondary | ICD-10-CM

## 2016-07-20 DIAGNOSIS — I723 Aneurysm of iliac artery: Secondary | ICD-10-CM | POA: Diagnosis not present

## 2016-07-20 MED ORDER — IOPAMIDOL (ISOVUE-370) INJECTION 76%
75.0000 mL | Freq: Once | INTRAVENOUS | Status: AC | PRN
  Administered 2016-07-20: 75 mL via INTRAVENOUS

## 2016-08-18 ENCOUNTER — Ambulatory Visit: Payer: Self-pay

## 2016-08-25 DIAGNOSIS — Z0000 Encounter for general adult medical examination without abnormal findings: Secondary | ICD-10-CM | POA: Diagnosis not present

## 2016-09-01 ENCOUNTER — Ambulatory Visit
Admission: RE | Admit: 2016-09-01 | Discharge: 2016-09-01 | Disposition: A | Discharge status: Home / Self Care | Payer: PRIVATE HEALTH INSURANCE | Source: Ambulatory Visit | Attending: Obstetrics and Gynecology | Admitting: Obstetrics and Gynecology

## 2016-09-01 DIAGNOSIS — Z1231 Encounter for screening mammogram for malignant neoplasm of breast: Secondary | ICD-10-CM

## 2016-09-15 DIAGNOSIS — K219 Gastro-esophageal reflux disease without esophagitis: Secondary | ICD-10-CM | POA: Diagnosis not present

## 2016-09-15 DIAGNOSIS — Z6821 Body mass index (BMI) 21.0-21.9, adult: Secondary | ICD-10-CM | POA: Diagnosis not present

## 2016-09-15 DIAGNOSIS — Z1389 Encounter for screening for other disorder: Secondary | ICD-10-CM | POA: Diagnosis not present

## 2016-11-18 DIAGNOSIS — L814 Other melanin hyperpigmentation: Secondary | ICD-10-CM | POA: Diagnosis not present

## 2016-12-28 DIAGNOSIS — J3089 Other allergic rhinitis: Secondary | ICD-10-CM | POA: Diagnosis not present

## 2016-12-28 DIAGNOSIS — J301 Allergic rhinitis due to pollen: Secondary | ICD-10-CM | POA: Diagnosis not present

## 2016-12-28 DIAGNOSIS — K200 Eosinophilic esophagitis: Secondary | ICD-10-CM | POA: Diagnosis not present

## 2017-01-06 DIAGNOSIS — J301 Allergic rhinitis due to pollen: Secondary | ICD-10-CM | POA: Diagnosis not present

## 2017-01-06 DIAGNOSIS — J3081 Allergic rhinitis due to animal (cat) (dog) hair and dander: Secondary | ICD-10-CM | POA: Diagnosis not present

## 2017-01-07 DIAGNOSIS — J3089 Other allergic rhinitis: Secondary | ICD-10-CM | POA: Diagnosis not present

## 2017-01-12 DIAGNOSIS — J3089 Other allergic rhinitis: Secondary | ICD-10-CM | POA: Diagnosis not present

## 2017-01-12 DIAGNOSIS — J3081 Allergic rhinitis due to animal (cat) (dog) hair and dander: Secondary | ICD-10-CM | POA: Diagnosis not present

## 2017-01-12 DIAGNOSIS — J301 Allergic rhinitis due to pollen: Secondary | ICD-10-CM | POA: Diagnosis not present

## 2017-01-19 DIAGNOSIS — J301 Allergic rhinitis due to pollen: Secondary | ICD-10-CM | POA: Diagnosis not present

## 2017-01-19 DIAGNOSIS — J3081 Allergic rhinitis due to animal (cat) (dog) hair and dander: Secondary | ICD-10-CM | POA: Diagnosis not present

## 2017-01-19 DIAGNOSIS — J3089 Other allergic rhinitis: Secondary | ICD-10-CM | POA: Diagnosis not present

## 2017-01-23 DIAGNOSIS — Z23 Encounter for immunization: Secondary | ICD-10-CM | POA: Diagnosis not present

## 2017-01-26 DIAGNOSIS — J3089 Other allergic rhinitis: Secondary | ICD-10-CM | POA: Diagnosis not present

## 2017-01-26 DIAGNOSIS — J301 Allergic rhinitis due to pollen: Secondary | ICD-10-CM | POA: Diagnosis not present

## 2017-01-26 DIAGNOSIS — J3081 Allergic rhinitis due to animal (cat) (dog) hair and dander: Secondary | ICD-10-CM | POA: Diagnosis not present

## 2017-02-02 DIAGNOSIS — J3089 Other allergic rhinitis: Secondary | ICD-10-CM | POA: Diagnosis not present

## 2017-02-02 DIAGNOSIS — J301 Allergic rhinitis due to pollen: Secondary | ICD-10-CM | POA: Diagnosis not present

## 2017-02-02 DIAGNOSIS — J3081 Allergic rhinitis due to animal (cat) (dog) hair and dander: Secondary | ICD-10-CM | POA: Diagnosis not present

## 2017-02-09 DIAGNOSIS — J3081 Allergic rhinitis due to animal (cat) (dog) hair and dander: Secondary | ICD-10-CM | POA: Diagnosis not present

## 2017-02-09 DIAGNOSIS — J301 Allergic rhinitis due to pollen: Secondary | ICD-10-CM | POA: Diagnosis not present

## 2017-02-09 DIAGNOSIS — J3089 Other allergic rhinitis: Secondary | ICD-10-CM | POA: Diagnosis not present

## 2017-02-16 DIAGNOSIS — J3089 Other allergic rhinitis: Secondary | ICD-10-CM | POA: Diagnosis not present

## 2017-02-16 DIAGNOSIS — J301 Allergic rhinitis due to pollen: Secondary | ICD-10-CM | POA: Diagnosis not present

## 2017-02-16 DIAGNOSIS — J3081 Allergic rhinitis due to animal (cat) (dog) hair and dander: Secondary | ICD-10-CM | POA: Diagnosis not present

## 2017-02-22 DIAGNOSIS — J3081 Allergic rhinitis due to animal (cat) (dog) hair and dander: Secondary | ICD-10-CM | POA: Diagnosis not present

## 2017-02-22 DIAGNOSIS — J3089 Other allergic rhinitis: Secondary | ICD-10-CM | POA: Diagnosis not present

## 2017-02-22 DIAGNOSIS — J301 Allergic rhinitis due to pollen: Secondary | ICD-10-CM | POA: Diagnosis not present

## 2017-02-23 DIAGNOSIS — N644 Mastodynia: Secondary | ICD-10-CM | POA: Diagnosis not present

## 2017-02-23 DIAGNOSIS — N6324 Unspecified lump in the left breast, lower inner quadrant: Secondary | ICD-10-CM | POA: Diagnosis not present

## 2017-02-25 ENCOUNTER — Other Ambulatory Visit: Payer: Self-pay | Admitting: Obstetrics and Gynecology

## 2017-02-25 DIAGNOSIS — N63 Unspecified lump in unspecified breast: Secondary | ICD-10-CM

## 2017-02-25 DIAGNOSIS — N644 Mastodynia: Secondary | ICD-10-CM

## 2017-03-01 DIAGNOSIS — J301 Allergic rhinitis due to pollen: Secondary | ICD-10-CM | POA: Diagnosis not present

## 2017-03-01 DIAGNOSIS — J3081 Allergic rhinitis due to animal (cat) (dog) hair and dander: Secondary | ICD-10-CM | POA: Diagnosis not present

## 2017-03-01 DIAGNOSIS — J3089 Other allergic rhinitis: Secondary | ICD-10-CM | POA: Diagnosis not present

## 2017-03-02 ENCOUNTER — Ambulatory Visit
Admission: RE | Admit: 2017-03-02 | Discharge: 2017-03-02 | Disposition: A | Discharge status: Home / Self Care | Payer: 59 | Source: Ambulatory Visit | Attending: Obstetrics and Gynecology | Admitting: Obstetrics and Gynecology

## 2017-03-02 DIAGNOSIS — N644 Mastodynia: Secondary | ICD-10-CM

## 2017-03-02 DIAGNOSIS — N63 Unspecified lump in unspecified breast: Secondary | ICD-10-CM

## 2017-03-02 DIAGNOSIS — R922 Inconclusive mammogram: Secondary | ICD-10-CM | POA: Diagnosis not present

## 2017-03-08 DIAGNOSIS — J3081 Allergic rhinitis due to animal (cat) (dog) hair and dander: Secondary | ICD-10-CM | POA: Diagnosis not present

## 2017-03-08 DIAGNOSIS — J301 Allergic rhinitis due to pollen: Secondary | ICD-10-CM | POA: Diagnosis not present

## 2017-03-08 DIAGNOSIS — J3089 Other allergic rhinitis: Secondary | ICD-10-CM | POA: Diagnosis not present

## 2017-03-15 DIAGNOSIS — J3089 Other allergic rhinitis: Secondary | ICD-10-CM | POA: Diagnosis not present

## 2017-03-15 DIAGNOSIS — J301 Allergic rhinitis due to pollen: Secondary | ICD-10-CM | POA: Diagnosis not present

## 2017-03-15 DIAGNOSIS — J3081 Allergic rhinitis due to animal (cat) (dog) hair and dander: Secondary | ICD-10-CM | POA: Diagnosis not present

## 2017-03-23 DIAGNOSIS — J3081 Allergic rhinitis due to animal (cat) (dog) hair and dander: Secondary | ICD-10-CM | POA: Diagnosis not present

## 2017-03-23 DIAGNOSIS — J3089 Other allergic rhinitis: Secondary | ICD-10-CM | POA: Diagnosis not present

## 2017-03-23 DIAGNOSIS — J301 Allergic rhinitis due to pollen: Secondary | ICD-10-CM | POA: Diagnosis not present

## 2017-03-29 DIAGNOSIS — J301 Allergic rhinitis due to pollen: Secondary | ICD-10-CM | POA: Diagnosis not present

## 2017-03-29 DIAGNOSIS — J3089 Other allergic rhinitis: Secondary | ICD-10-CM | POA: Diagnosis not present

## 2017-03-29 DIAGNOSIS — J3081 Allergic rhinitis due to animal (cat) (dog) hair and dander: Secondary | ICD-10-CM | POA: Diagnosis not present

## 2017-04-05 DIAGNOSIS — J3081 Allergic rhinitis due to animal (cat) (dog) hair and dander: Secondary | ICD-10-CM | POA: Diagnosis not present

## 2017-04-05 DIAGNOSIS — J301 Allergic rhinitis due to pollen: Secondary | ICD-10-CM | POA: Diagnosis not present

## 2017-04-05 DIAGNOSIS — J3089 Other allergic rhinitis: Secondary | ICD-10-CM | POA: Diagnosis not present

## 2017-04-13 DIAGNOSIS — J3081 Allergic rhinitis due to animal (cat) (dog) hair and dander: Secondary | ICD-10-CM | POA: Diagnosis not present

## 2017-04-13 DIAGNOSIS — J301 Allergic rhinitis due to pollen: Secondary | ICD-10-CM | POA: Diagnosis not present

## 2017-04-13 DIAGNOSIS — J3089 Other allergic rhinitis: Secondary | ICD-10-CM | POA: Diagnosis not present

## 2017-04-19 DIAGNOSIS — J3081 Allergic rhinitis due to animal (cat) (dog) hair and dander: Secondary | ICD-10-CM | POA: Diagnosis not present

## 2017-04-19 DIAGNOSIS — J3089 Other allergic rhinitis: Secondary | ICD-10-CM | POA: Diagnosis not present

## 2017-04-19 DIAGNOSIS — J301 Allergic rhinitis due to pollen: Secondary | ICD-10-CM | POA: Diagnosis not present

## 2017-04-25 ENCOUNTER — Encounter (HOSPITAL_BASED_OUTPATIENT_CLINIC_OR_DEPARTMENT_OTHER): Payer: Self-pay | Admitting: *Deleted

## 2017-04-25 ENCOUNTER — Emergency Department (HOSPITAL_BASED_OUTPATIENT_CLINIC_OR_DEPARTMENT_OTHER)
Admission: EM | Admit: 2017-04-25 | Discharge: 2017-04-25 | Disposition: A | Discharge status: Home / Self Care | Payer: 59 | Attending: Emergency Medicine | Admitting: Emergency Medicine

## 2017-04-25 ENCOUNTER — Other Ambulatory Visit: Payer: Self-pay

## 2017-04-25 DIAGNOSIS — I491 Atrial premature depolarization: Secondary | ICD-10-CM | POA: Insufficient documentation

## 2017-04-25 DIAGNOSIS — R002 Palpitations: Secondary | ICD-10-CM

## 2017-04-25 LAB — CBC WITH DIFFERENTIAL/PLATELET
Basophils Absolute: 0 10*3/uL (ref 0.0–0.1)
Basophils Relative: 0 %
Eosinophils Absolute: 0.2 10*3/uL (ref 0.0–0.7)
Eosinophils Relative: 3 %
HEMATOCRIT: 38.7 % (ref 36.0–46.0)
HEMOGLOBIN: 12.9 g/dL (ref 12.0–15.0)
LYMPHS ABS: 2.1 10*3/uL (ref 0.7–4.0)
LYMPHS PCT: 30 %
MCH: 26.1 pg (ref 26.0–34.0)
MCHC: 33.3 g/dL (ref 30.0–36.0)
MCV: 78.2 fL (ref 78.0–100.0)
MONOS PCT: 7 %
Monocytes Absolute: 0.5 10*3/uL (ref 0.1–1.0)
NEUTROS ABS: 4.2 10*3/uL (ref 1.7–7.7)
NEUTROS PCT: 60 %
Platelets: 264 10*3/uL (ref 150–400)
RBC: 4.95 MIL/uL (ref 3.87–5.11)
RDW: 14 % (ref 11.5–15.5)
WBC: 7 10*3/uL (ref 4.0–10.5)

## 2017-04-25 LAB — BASIC METABOLIC PANEL
Anion gap: 10 (ref 5–15)
BUN: 14 mg/dL (ref 6–20)
CHLORIDE: 103 mmol/L (ref 101–111)
CO2: 26 mmol/L (ref 22–32)
Calcium: 9.4 mg/dL (ref 8.9–10.3)
Creatinine, Ser: 0.61 mg/dL (ref 0.44–1.00)
GFR calc non Af Amer: >60 mL/min (ref >60)
Glucose, Bld: 122 mg/dL — ABNORMAL HIGH (ref 65–99)
POTASSIUM: 3.5 mmol/L (ref 3.5–5.1)
SODIUM: 139 mmol/L (ref 135–145)

## 2017-04-25 LAB — TROPONIN I

## 2017-04-25 NOTE — ED Triage Notes (Signed)
Pt c/o palpitations x 4 hrs , denies SOb or n/v

## 2017-04-25 NOTE — Discharge Instructions (Signed)
You were seen in the ED for palpitations and found to have premature atrial complexes. I have attached some information on these for you to review. Call your PCP for a follow up appointment as needed. I have attached the name of a Cardiologist to call if symptoms worsen.   Return to the ED with any chest pain, difficulty breathing, or palpitations.

## 2017-04-25 NOTE — ED Provider Notes (Signed)
Emergency Department Provider Note   I have reviewed the triage vital signs and the nursing notes.   HISTORY  Chief Complaint Palpitations   HPI [name] is a 46 y.o. female presents to the emergency department for evaluation of palpitations.  Symptoms began during church.  The patient took her pulse and noticed it was irregular.  She denies lightheadedness, chest pain, or dyspnea.  She had a strange sensation in her right, posterior shoulder like "someone was cracking an egg" meaning that something was spreading or running down her skin but denies pain/pressure.  No history of similar symptoms.  The patient returned home where she ate dinner but continued to have the palpitations and decided to present to the emergency department.  Denies new medications or herbal supplements.  No modifying factors.  No radiation of symptoms.   Past Medical History:  Diagnosis Date  . Barrett's syndrome   . Gastric polyp   . Hiatal hernia   . HNP (herniated nucleus pulposus with myelopathy), thoracic   . Lumbar disc disease   . Lumbar spondylosis     Patient Active Problem List   Diagnosis Date Noted  . Varicose veins of lower extremities with other complications 16/02/9603  . Nevus, non-neoplastic 11/23/2011    Past Surgical History:  Procedure Laterality Date  . CESAREAN SECTION  2007, 2009   x 2  . HEMILAMINOTOMY LUMBAR SPINE  09/2003  . KNEE ARTHROSCOPY Right   . MICRODISCECTOMY LUMBAR     L5-S1  . TUBAL LIGATION      Current Outpatient Rx  . Order #: [id number] Class: Historical Med  . Order #: [id number] Class: Historical Med  . Order #: [id number] Class: Historical Med  . Order #: [id number] Class: Historical Med  . Order #: [id number] Class: Historical Med  . Order #: [id number] Class: Historical Med  . Order #: [id number] Class: Historical Med  . Order #: [id number] Class: Historical Med  . Order #: [id number] Class: Historical Med  . Order #: [id number] Class: Historical Med     Allergies Patient has no known allergies.  Family History  Problem Relation Age of Onset  . Hypertension Father   . Heart disease Father   . Leukemia Mother   . Breast cancer Maternal Aunt   . Colon cancer Neg Hx     Social History Social History   Tobacco Use  . Smoking status: Never Smoker  . Smokeless tobacco: Never Used  Substance Use Topics  . Alcohol use: Yes    Comment: 1-2 per month  . Drug use: No    Review of Systems  Constitutional: No fever/chills Eyes: No visual changes. ENT: No sore throat. Cardiovascular: Denies chest pain. Positive palpitations.  Respiratory: Denies shortness of breath. Gastrointestinal: No abdominal pain.  No nausea, no vomiting.  No diarrhea.  No constipation. Genitourinary: Negative for dysuria. Musculoskeletal: Negative for back pain. Skin: Negative for rash. Neurological: Negative for headaches, focal weakness or numbness.  10-point ROS otherwise negative.  ____________________________________________   PHYSICAL EXAM:  VITAL SIGNS: ED Triage Vitals  Enc Vitals Group     BP 04/25/17 2213 (!) 148/77     Pulse Rate 04/25/17 2213 68     Resp 04/25/17 2213 16     Temp 04/25/17 2213 97.7 F (36.5 C)     Temp Source 04/25/17 2213 Oral     SpO2 04/25/17 2213 100 %     Weight 04/25/17 2211 128 lb (58.1 kg)     Height 04/25/17 2211 5\' 3"  (1.6 m)  Pain Score 04/25/17 2211 0   Constitutional: Alert and oriented. Well appearing and in no acute distress. Eyes: Conjunctivae are normal.  Head: Atraumatic. Nose: No congestion/rhinnorhea. Mouth/Throat: Mucous membranes are moist.  Oropharynx non-erythematous. Neck: No stridor.   Cardiovascular: Normal rate with occasional premature atrial complexes. Good peripheral circulation. Grossly normal heart sounds.   Respiratory: Normal respiratory effort.  No retractions. Lungs CTAB. Gastrointestinal: Soft and nontender. No distention.  Musculoskeletal: No lower extremity  tenderness nor edema. No gross deformities of extremities. Neurologic:  Normal speech and language. No gross focal neurologic deficits are appreciated.  Skin:  Skin is warm, dry and intact. No rash noted.  ____________________________________________   LABS (all labs ordered are listed, but only abnormal results are displayed)  Labs Reviewed  BASIC METABOLIC PANEL - Abnormal; Notable for the following components:      Result Value   Glucose, Bld 122 (*)    All other components within normal limits  CBC WITH DIFFERENTIAL/PLATELET  TROPONIN I   ____________________________________________  EKG   EKG Interpretation  Date/Time:  Monday April 25 2017 22:16:23 EST Ventricular Rate:  62 PR Interval:    QRS Duration: 105 QT Interval:  426 QTC Calculation: 433 R Axis:   81 Text Interpretation:  Sinus rhythm Atrial premature complexes RSR' in V1 or V2, right VCD or RVH Probable left ventricular hypertrophy NoSTEMI.  Confirmed by Nanda Quinton [phone]) on 04/25/2017 10:39:59 PM Also confirmed by Nanda Quinton [phone]), editor Laurena Spies [phone])  on 04/26/2017 8:25:19 AM       ____________________________________________  RADIOLOGY  None ____________________________________________   PROCEDURES  Procedure(s) performed:   Procedures  None  ____________________________________________   INITIAL IMPRESSION / ASSESSMENT AND PLAN / ED COURSE  Pertinent labs & imaging results that were available during my care of the patient were reviewed by me and considered in my medical decision making (see chart for details).  Patient presents to the ED with intermittent palpitations over the last 4 hours.  Normal blood pressure.  Patient with occasional premature atrial complexes.  Plan for baseline labs to rule out electrolyte abnormality.  Will advise patient to decrease caffeine intake to follow with the primary care physician.  Plan to also mention regarding cardiology follow-up  as needed.   Labs reviewed. Plan for observation and PCP/Cardiology follow up if symptoms continue.   At this time, I do not feel there is any life-threatening condition present. I have reviewed and discussed all results (EKG, imaging, lab, urine as appropriate), exam findings with patient. I have reviewed nursing notes and appropriate previous records.  I feel the patient is safe to be discharged home without further emergent workup. Discussed usual and customary return precautions. Patient and family (if present) verbalize understanding and are comfortable with this plan.  Patient will follow-up with their primary care provider. If they do not have a primary care provider, information for follow-up has been provided to them. All questions have been answered.  ____________________________________________  FINAL CLINICAL IMPRESSION(S) / ED DIAGNOSES  Final diagnoses:  Palpitations  Premature atrial complexes    Note:  This document was prepared using Dragon voice recognition software and may include unintentional dictation errors.  Nanda Quinton, MD Emergency Medicine    Oluwanifemi Susman, [facility], MD 04/26/17 [phone]

## 2017-04-27 DIAGNOSIS — J301 Allergic rhinitis due to pollen: Secondary | ICD-10-CM | POA: Diagnosis not present

## 2017-04-27 DIAGNOSIS — J3081 Allergic rhinitis due to animal (cat) (dog) hair and dander: Secondary | ICD-10-CM | POA: Diagnosis not present

## 2017-04-27 DIAGNOSIS — J3089 Other allergic rhinitis: Secondary | ICD-10-CM | POA: Diagnosis not present

## 2017-04-29 ENCOUNTER — Telehealth: Payer: Self-pay | Admitting: Internal Medicine

## 2017-04-29 MED ORDER — METOPROLOL TARTRATE 25 MG PO TABS: 25.0000 mg | ORAL_TABLET | Freq: Two times a day (BID) | ORAL | 1 refills | Status: DC | PRN

## 2017-04-29 NOTE — Addendum Note (Signed)
Addended by: Stanton Kidney on: 04/29/2017 03:06 PM   Modules accepted: Orders

## 2017-04-29 NOTE — Telephone Encounter (Signed)
Called pt   She had texted in to say she was in ED on 12/24.  Complained of palpitations While in ED PACs noted  Sent home  Told to f/u in cardiology  Since then she has felt OK  No palpitations. Runs daily  Breathing is OK Cut back on caffeine.   Recomm:  Can try low dose metoprolol 25 bid  Take as needed if recur  Call in to  [place]

## 2017-05-05 DIAGNOSIS — J3089 Other allergic rhinitis: Secondary | ICD-10-CM | POA: Diagnosis not present

## 2017-05-05 DIAGNOSIS — J301 Allergic rhinitis due to pollen: Secondary | ICD-10-CM | POA: Diagnosis not present

## 2017-05-05 DIAGNOSIS — J3081 Allergic rhinitis due to animal (cat) (dog) hair and dander: Secondary | ICD-10-CM | POA: Diagnosis not present

## 2017-05-10 DIAGNOSIS — J3089 Other allergic rhinitis: Secondary | ICD-10-CM | POA: Diagnosis not present

## 2017-05-10 DIAGNOSIS — J301 Allergic rhinitis due to pollen: Secondary | ICD-10-CM | POA: Diagnosis not present

## 2017-05-10 DIAGNOSIS — J3081 Allergic rhinitis due to animal (cat) (dog) hair and dander: Secondary | ICD-10-CM | POA: Diagnosis not present

## 2017-05-17 DIAGNOSIS — J3089 Other allergic rhinitis: Secondary | ICD-10-CM | POA: Diagnosis not present

## 2017-05-17 DIAGNOSIS — J301 Allergic rhinitis due to pollen: Secondary | ICD-10-CM | POA: Diagnosis not present

## 2017-05-17 DIAGNOSIS — J3081 Allergic rhinitis due to animal (cat) (dog) hair and dander: Secondary | ICD-10-CM | POA: Diagnosis not present

## 2017-05-19 ENCOUNTER — Encounter: Payer: Self-pay | Admitting: Cardiology

## 2017-05-19 ENCOUNTER — Ambulatory Visit: Payer: 59 | Admitting: Cardiology

## 2017-05-19 VITALS — BP 108/76 | HR 62 | Ht 63.5 in | Wt 130.0 lb

## 2017-05-19 DIAGNOSIS — R002 Palpitations: Secondary | ICD-10-CM | POA: Diagnosis not present

## 2017-05-19 DIAGNOSIS — I491 Atrial premature depolarization: Secondary | ICD-10-CM | POA: Diagnosis not present

## 2017-05-19 NOTE — Patient Instructions (Signed)
Medication Instructions:  Your physician recommends that you continue on your current medications as directed. Please refer to the Current Medication list given to you today.   Labwork: None ordered  Testing/Procedures: Your physician has requested that you have an echocardiogram. Echocardiography is a painless test that uses sound waves to create images of your heart. It provides your doctor with information about the size and shape of your heart and how well your heart's chambers and valves are working. This procedure takes approximately one hour. There are no restrictions for this procedure.    Follow-Up: Your physician recommends that you schedule a follow-up appointment in: 1ST AVAILABLE WITH DR. ROSS ONLY   Any Other Special Instructions Will Be Listed Below (If Applicable). Echocardiogram An echocardiogram, or echocardiography, uses sound waves (ultrasound) to produce an image of your heart. The echocardiogram is simple, painless, obtained within a short period of time, and offers valuable information to your health care provider. The images from an echocardiogram can provide information such as:  Evidence of coronary artery disease (CAD).  Heart size.  Heart muscle function.  Heart valve function.  Aneurysm detection.  Evidence of a past heart attack.  Fluid buildup around the heart.  Heart muscle thickening.  Assess heart valve function.  Tell a health care provider about:  Any allergies you have.  All medicines you are taking, including vitamins, herbs, eye drops, creams, and over-the-counter medicines.  Any problems you or family members have had with anesthetic medicines.  Any blood disorders you have.  Any surgeries you have had.  Any medical conditions you have.  Whether you are pregnant or may be pregnant. What happens before the procedure? No special preparation is needed. Eat and drink normally. What happens during the procedure?  In order to  produce an image of your heart, gel will be applied to your chest and a wand-like tool (transducer) will be moved over your chest. The gel will help transmit the sound waves from the transducer. The sound waves will harmlessly bounce off your heart to allow the heart images to be captured in real-time motion. These images will then be recorded.  You may need an IV to receive a medicine that improves the quality of the pictures. What happens after the procedure? You may return to your normal schedule including diet, activities, and medicines, unless your health care provider tells you otherwise. This information is not intended to replace advice given to you by your health care provider. Make sure you discuss any questions you have with your health care provider. Document Released: 04/16/2000 Document Revised: 12/06/2015 Document Reviewed: 12/25/2012 Elsevier Interactive Patient Education  2017 [employer].     If you need a refill on your cardiac medications before your next appointment, please call your pharmacy.

## 2017-05-19 NOTE — Progress Notes (Signed)
05/19/2017 [name]   1971-04-06  [id number]  Primary Physician Burnard Bunting, MD Primary Cardiologist: Dr. Harrington Challenger  Reason for Visit/CC: Post ED for F/u PACs  HPI:  [name] is a 47 y.o. female who is being seen today for post ED f/u for palpitations. She notes she was seen many years ago by Dr. Radford Pax when she was at [facility]. Has h/o murmur and marathon runner. She notes she has had 2 echocardiograms in the past but no records on file. Family h/o notable for afib (father). He is a patient here in our clinic. Other medical problems include GERD, on Prilosec. She notes her husband recently died a few weeks ago. Now a single mother. Works as a Animal nutritionist.   She was recently seen in the ED for palpitations. No other symptoms, denying chest pain, dyspnea and lightheadedness. Telemetry in the ED showed occasional PACs. CBC, BMP and Troponin were all WNL. Pt was reassured and instructed to f/u with cardiology and to avoid caffeine. Dr. Harrington Challenger prescribed low dose metoprolol, 25 mg BID to take as needed, if symptoms recur.  Today in clinic, she denies any recurrent palpitations. She has not had to use metoprolol. EKG today shows sinus bradycardia 58 bpm. Rightward axis. Qt/QTc 422/414 ms.    Current Meds  Medication Sig  . BIOTIN PO Take 1 capsule by mouth daily.  . calcium-vitamin D (OSCAL WITH D) 500-200 MG-UNIT per tablet Take 1 tablet by mouth 2 (two) times daily.  . fish oil-omega-3 fatty acids 1000 MG capsule Take 1 g by mouth daily.  . Glucosamine-Chondroitin 250-200 MG TABS Take by mouth.  . Lactobacillus-Inulin ([place] PO) Take by mouth.  . levocetirizine (XYZAL) 5 MG tablet Take 5 mg by mouth every evening.  . Multiple Vitamin (MULTIVITAMIN WITH MINERALS) TABS Take 1 tablet by mouth daily.  Marland Kitchen omeprazole (PRILOSEC) 20 MG capsule Take 20 mg by mouth daily.  . Psyllium-Calcium (METAMUCIL PLUS CALCIUM) CAPS Take by mouth.   Current Facility-Administered  Medications for the 05/19/17 encounter (Office Visit) with Consuelo Pandy, PA-C  Medication  . 0.9 %  sodium chloride infusion   No Known Allergies Past Medical History:  Diagnosis Date  . Barrett's syndrome   . Gastric polyp   . Hiatal hernia   . HNP (herniated nucleus pulposus with myelopathy), thoracic   . Lumbar disc disease   . Lumbar spondylosis    Family History  Problem Relation Age of Onset  . Hypertension Father   . Heart disease Father   . Leukemia Mother   . Breast cancer Maternal Aunt   . Colon cancer Neg Hx    Past Surgical History:  Procedure Laterality Date  . CESAREAN SECTION  2007, 2009   x 2  . HEMILAMINOTOMY LUMBAR SPINE  09/2003  . KNEE ARTHROSCOPY Right   . MICRODISCECTOMY LUMBAR     L5-S1  . TUBAL LIGATION     Social History   Socioeconomic History  . Marital status: Married    Spouse name: Not on file  . Number of children: 2  . Years of education: Not on file  . Highest education level: Not on file  Social Needs  . Financial resource strain: Not on file  . Food insecurity - worry: Not on file  . Food insecurity - inability: Not on file  . Transportation needs - medical: Not on file  . Transportation needs - non-medical: Not on file  Occupational History  . Occupation: Airline pilot  Employer: [employer]  Tobacco Use  . Smoking status: Never Smoker  . Smokeless tobacco: Never Used  Substance and Sexual Activity  . Alcohol use: Yes    Comment: 1-2 per month  . Drug use: No  . Sexual activity: Not on file  Other Topics Concern  . Not on file  Social History Narrative  . Not on file     Review of Systems: General: negative for chills, fever, night sweats or weight changes.  Cardiovascular: negative for chest pain, dyspnea on exertion, edema, orthopnea, palpitations, paroxysmal nocturnal dyspnea or shortness of breath Dermatological: negative for rash Respiratory: negative for cough or wheezing Urologic: negative for  hematuria Abdominal: negative for nausea, vomiting, diarrhea, bright red blood per rectum, melena, or hematemesis Neurologic: negative for visual changes, syncope, or dizziness All other systems reviewed and are otherwise negative except as noted above.   Physical Exam:  Blood pressure 108/76, pulse 62, height 5' 3.5" (1.613 m), weight 130 lb (59 kg), SpO2 99 %.  General appearance: alert and no distress Neck: no carotid bruit and no JVD Lungs: clear to auscultation bilaterally Heart: regular rhythm, slow rate, 2/6 murmur at RUSB Extremities: extremities normal, atraumatic, no cyanosis or edema Pulses: 2+ and symmetric Skin: Skin color, texture, turgor normal. No rashes or lesions Neurologic: Grossly normal  EKG sinus bradycardia, 58 bpm, Qt/QTc 422/414 ms, rightward axis -- personally reviewed   ASSESSMENT AND PLAN:   1. PACs: pt denies any recurrent symptoms since day of ED presentation 04/25/17. Labs in ED including CBC, BMP and Troponin normal. EKG today shows sinus bradycardia 58 bpm. Pt has slight murmur at RUSB and is a marathon runner. Will obtain 2D echo to screen for structural heart disease. Take metoprolol PRN. F/u with Dr. Harrington Challenger after echo.    Tarhonda Hollenberg Ladoris Gene, MHS [facility] 05/19/2017 3:02 PM

## 2017-05-23 NOTE — Addendum Note (Signed)
Addended by: Mendel Ryder on: 05/23/2017 08:22 AM   Modules accepted: Orders

## 2017-05-24 DIAGNOSIS — J3081 Allergic rhinitis due to animal (cat) (dog) hair and dander: Secondary | ICD-10-CM | POA: Diagnosis not present

## 2017-05-24 DIAGNOSIS — J301 Allergic rhinitis due to pollen: Secondary | ICD-10-CM | POA: Diagnosis not present

## 2017-05-24 DIAGNOSIS — J3089 Other allergic rhinitis: Secondary | ICD-10-CM | POA: Diagnosis not present

## 2017-06-01 ENCOUNTER — Other Ambulatory Visit: Payer: Self-pay

## 2017-06-01 ENCOUNTER — Ambulatory Visit (HOSPITAL_COMMUNITY): Payer: 59 | Attending: Internal Medicine

## 2017-06-01 DIAGNOSIS — I340 Nonrheumatic mitral (valve) insufficiency: Secondary | ICD-10-CM | POA: Insufficient documentation

## 2017-06-01 DIAGNOSIS — J301 Allergic rhinitis due to pollen: Secondary | ICD-10-CM | POA: Diagnosis not present

## 2017-06-01 DIAGNOSIS — I491 Atrial premature depolarization: Secondary | ICD-10-CM | POA: Diagnosis not present

## 2017-06-01 DIAGNOSIS — J3089 Other allergic rhinitis: Secondary | ICD-10-CM | POA: Diagnosis not present

## 2017-06-01 DIAGNOSIS — R002 Palpitations: Secondary | ICD-10-CM | POA: Diagnosis not present

## 2017-06-01 DIAGNOSIS — J3081 Allergic rhinitis due to animal (cat) (dog) hair and dander: Secondary | ICD-10-CM | POA: Diagnosis not present

## 2017-06-03 ENCOUNTER — Other Ambulatory Visit: Payer: Self-pay | Admitting: Internal Medicine

## 2017-06-03 DIAGNOSIS — R002 Palpitations: Secondary | ICD-10-CM

## 2017-06-07 DIAGNOSIS — J3089 Other allergic rhinitis: Secondary | ICD-10-CM | POA: Diagnosis not present

## 2017-06-07 DIAGNOSIS — J3081 Allergic rhinitis due to animal (cat) (dog) hair and dander: Secondary | ICD-10-CM | POA: Diagnosis not present

## 2017-06-07 DIAGNOSIS — J301 Allergic rhinitis due to pollen: Secondary | ICD-10-CM | POA: Diagnosis not present

## 2017-06-08 ENCOUNTER — Ambulatory Visit (HOSPITAL_COMMUNITY)
Admission: RE | Admit: 2017-06-08 | Discharge: 2017-06-08 | Disposition: A | Discharge status: Home / Self Care | Payer: 59 | Source: Ambulatory Visit | Attending: Internal Medicine | Admitting: Internal Medicine

## 2017-06-08 DIAGNOSIS — R931 Abnormal findings on diagnostic imaging of heart and coronary circulation: Secondary | ICD-10-CM | POA: Insufficient documentation

## 2017-06-08 DIAGNOSIS — R002 Palpitations: Secondary | ICD-10-CM

## 2017-06-08 NOTE — Progress Notes (Signed)
  Echocardiogram 2D Echocardiogram has been performed.  [name] 06/08/2017, 12:02 PM

## 2017-06-09 ENCOUNTER — Telehealth: Payer: Self-pay | Admitting: Internal Medicine

## 2017-06-09 DIAGNOSIS — Q211 Atrial septal defect: Secondary | ICD-10-CM

## 2017-06-09 DIAGNOSIS — Q2112 Patent foramen ovale: Secondary | ICD-10-CM

## 2017-06-09 NOTE — Telephone Encounter (Signed)
Reviewed echo results with Dr Meda Coffee. I have also reviewed with patient   Would recomm cardiac CT to evaluate 1.  Chamber sizes particularly R sided 2  Atrial septum and defect 3  IVC  Pt does not work on Wed but said she is flexible May need preCT labs.

## 2017-06-09 NOTE — Addendum Note (Signed)
Addended by: Rodman Key on: 06/09/2017 01:34 PM   Modules accepted: Orders

## 2017-06-14 DIAGNOSIS — J301 Allergic rhinitis due to pollen: Secondary | ICD-10-CM | POA: Diagnosis not present

## 2017-06-14 DIAGNOSIS — J3089 Other allergic rhinitis: Secondary | ICD-10-CM | POA: Diagnosis not present

## 2017-06-14 DIAGNOSIS — J3081 Allergic rhinitis due to animal (cat) (dog) hair and dander: Secondary | ICD-10-CM | POA: Diagnosis not present

## 2017-06-15 ENCOUNTER — Other Ambulatory Visit (HOSPITAL_COMMUNITY): Payer: 59

## 2017-06-17 ENCOUNTER — Ambulatory Visit (HOSPITAL_COMMUNITY)
Admission: RE | Admit: 2017-06-17 | Discharge: 2017-06-17 | Disposition: A | Discharge status: Home / Self Care | Payer: 59 | Source: Ambulatory Visit | Attending: Internal Medicine | Admitting: Internal Medicine

## 2017-06-17 DIAGNOSIS — Q211 Atrial septal defect: Secondary | ICD-10-CM | POA: Insufficient documentation

## 2017-06-17 DIAGNOSIS — R079 Chest pain, unspecified: Secondary | ICD-10-CM | POA: Diagnosis not present

## 2017-06-17 DIAGNOSIS — Q2112 Patent foramen ovale: Secondary | ICD-10-CM

## 2017-06-17 MED ORDER — NITROGLYCERIN 0.4 MG SL SUBL
SUBLINGUAL_TABLET | SUBLINGUAL | Status: AC
  Filled 2017-06-17: qty 2

## 2017-06-17 MED ORDER — IOPAMIDOL (ISOVUE-370) INJECTION 76%
INTRAVENOUS | Status: AC
  Administered 2017-06-17: 80 mL
  Filled 2017-06-17: qty 100

## 2017-06-17 MED ORDER — NITROGLYCERIN 0.4 MG SL SUBL
0.8000 mg | SUBLINGUAL_TABLET | Freq: Once | SUBLINGUAL | Status: AC
  Administered 2017-06-17: 0.8 mg via SUBLINGUAL
  Filled 2017-06-17: qty 25

## 2017-06-17 NOTE — Progress Notes (Signed)
CT scan completed. Tolerated well. D/C home awake and alert. In no distress. 

## 2017-06-21 DIAGNOSIS — J301 Allergic rhinitis due to pollen: Secondary | ICD-10-CM | POA: Diagnosis not present

## 2017-06-21 DIAGNOSIS — J3081 Allergic rhinitis due to animal (cat) (dog) hair and dander: Secondary | ICD-10-CM | POA: Diagnosis not present

## 2017-06-21 DIAGNOSIS — J3089 Other allergic rhinitis: Secondary | ICD-10-CM | POA: Diagnosis not present

## 2017-06-23 ENCOUNTER — Other Ambulatory Visit: Payer: Self-pay | Admitting: *Deleted

## 2017-06-23 DIAGNOSIS — Q211 Atrial septal defect: Secondary | ICD-10-CM

## 2017-06-23 DIAGNOSIS — Q2112 Patent foramen ovale: Secondary | ICD-10-CM

## 2017-06-24 ENCOUNTER — Ambulatory Visit: Payer: 59 | Admitting: Internal Medicine

## 2017-06-28 DIAGNOSIS — J301 Allergic rhinitis due to pollen: Secondary | ICD-10-CM | POA: Diagnosis not present

## 2017-06-28 DIAGNOSIS — J3081 Allergic rhinitis due to animal (cat) (dog) hair and dander: Secondary | ICD-10-CM | POA: Diagnosis not present

## 2017-06-28 DIAGNOSIS — J3089 Other allergic rhinitis: Secondary | ICD-10-CM | POA: Diagnosis not present

## 2017-07-05 DIAGNOSIS — J301 Allergic rhinitis due to pollen: Secondary | ICD-10-CM | POA: Diagnosis not present

## 2017-07-05 DIAGNOSIS — J3081 Allergic rhinitis due to animal (cat) (dog) hair and dander: Secondary | ICD-10-CM | POA: Diagnosis not present

## 2017-07-05 DIAGNOSIS — J3089 Other allergic rhinitis: Secondary | ICD-10-CM | POA: Diagnosis not present

## 2017-07-06 DIAGNOSIS — D2261 Melanocytic nevi of right upper limb, including shoulder: Secondary | ICD-10-CM | POA: Diagnosis not present

## 2017-07-06 DIAGNOSIS — D2262 Melanocytic nevi of left upper limb, including shoulder: Secondary | ICD-10-CM | POA: Diagnosis not present

## 2017-07-06 DIAGNOSIS — D2271 Melanocytic nevi of right lower limb, including hip: Secondary | ICD-10-CM | POA: Diagnosis not present

## 2017-07-12 DIAGNOSIS — J3081 Allergic rhinitis due to animal (cat) (dog) hair and dander: Secondary | ICD-10-CM | POA: Diagnosis not present

## 2017-07-12 DIAGNOSIS — J3089 Other allergic rhinitis: Secondary | ICD-10-CM | POA: Diagnosis not present

## 2017-07-12 DIAGNOSIS — J301 Allergic rhinitis due to pollen: Secondary | ICD-10-CM | POA: Diagnosis not present

## 2017-07-19 ENCOUNTER — Other Ambulatory Visit: Payer: Self-pay | Admitting: Obstetrics and Gynecology

## 2017-07-19 DIAGNOSIS — J3089 Other allergic rhinitis: Secondary | ICD-10-CM | POA: Diagnosis not present

## 2017-07-19 DIAGNOSIS — J3081 Allergic rhinitis due to animal (cat) (dog) hair and dander: Secondary | ICD-10-CM | POA: Diagnosis not present

## 2017-07-19 DIAGNOSIS — Z139 Encounter for screening, unspecified: Secondary | ICD-10-CM

## 2017-07-19 DIAGNOSIS — J301 Allergic rhinitis due to pollen: Secondary | ICD-10-CM | POA: Diagnosis not present

## 2017-07-26 DIAGNOSIS — J301 Allergic rhinitis due to pollen: Secondary | ICD-10-CM | POA: Diagnosis not present

## 2017-07-26 DIAGNOSIS — J3081 Allergic rhinitis due to animal (cat) (dog) hair and dander: Secondary | ICD-10-CM | POA: Diagnosis not present

## 2017-07-26 DIAGNOSIS — J3089 Other allergic rhinitis: Secondary | ICD-10-CM | POA: Diagnosis not present

## 2017-07-27 DIAGNOSIS — J3089 Other allergic rhinitis: Secondary | ICD-10-CM | POA: Diagnosis not present

## 2017-07-27 DIAGNOSIS — K200 Eosinophilic esophagitis: Secondary | ICD-10-CM | POA: Diagnosis not present

## 2017-07-27 DIAGNOSIS — J301 Allergic rhinitis due to pollen: Secondary | ICD-10-CM | POA: Diagnosis not present

## 2017-08-01 DIAGNOSIS — J3081 Allergic rhinitis due to animal (cat) (dog) hair and dander: Secondary | ICD-10-CM | POA: Diagnosis not present

## 2017-08-01 DIAGNOSIS — J3089 Other allergic rhinitis: Secondary | ICD-10-CM | POA: Diagnosis not present

## 2017-08-01 DIAGNOSIS — J301 Allergic rhinitis due to pollen: Secondary | ICD-10-CM | POA: Diagnosis not present

## 2017-08-08 DIAGNOSIS — J3089 Other allergic rhinitis: Secondary | ICD-10-CM | POA: Diagnosis not present

## 2017-08-08 DIAGNOSIS — J3081 Allergic rhinitis due to animal (cat) (dog) hair and dander: Secondary | ICD-10-CM | POA: Diagnosis not present

## 2017-08-08 DIAGNOSIS — J301 Allergic rhinitis due to pollen: Secondary | ICD-10-CM | POA: Diagnosis not present

## 2017-08-15 DIAGNOSIS — J3081 Allergic rhinitis due to animal (cat) (dog) hair and dander: Secondary | ICD-10-CM | POA: Diagnosis not present

## 2017-08-15 DIAGNOSIS — J3089 Other allergic rhinitis: Secondary | ICD-10-CM | POA: Diagnosis not present

## 2017-08-15 DIAGNOSIS — J301 Allergic rhinitis due to pollen: Secondary | ICD-10-CM | POA: Diagnosis not present

## 2017-08-22 DIAGNOSIS — J3089 Other allergic rhinitis: Secondary | ICD-10-CM | POA: Diagnosis not present

## 2017-08-22 DIAGNOSIS — J3081 Allergic rhinitis due to animal (cat) (dog) hair and dander: Secondary | ICD-10-CM | POA: Diagnosis not present

## 2017-08-22 DIAGNOSIS — J301 Allergic rhinitis due to pollen: Secondary | ICD-10-CM | POA: Diagnosis not present

## 2017-08-24 DIAGNOSIS — J3081 Allergic rhinitis due to animal (cat) (dog) hair and dander: Secondary | ICD-10-CM | POA: Diagnosis not present

## 2017-08-24 DIAGNOSIS — J301 Allergic rhinitis due to pollen: Secondary | ICD-10-CM | POA: Diagnosis not present

## 2017-08-29 DIAGNOSIS — J301 Allergic rhinitis due to pollen: Secondary | ICD-10-CM | POA: Diagnosis not present

## 2017-08-29 DIAGNOSIS — J3089 Other allergic rhinitis: Secondary | ICD-10-CM | POA: Diagnosis not present

## 2017-08-29 DIAGNOSIS — J3081 Allergic rhinitis due to animal (cat) (dog) hair and dander: Secondary | ICD-10-CM | POA: Diagnosis not present

## 2017-09-05 DIAGNOSIS — J3081 Allergic rhinitis due to animal (cat) (dog) hair and dander: Secondary | ICD-10-CM | POA: Diagnosis not present

## 2017-09-05 DIAGNOSIS — J3089 Other allergic rhinitis: Secondary | ICD-10-CM | POA: Diagnosis not present

## 2017-09-05 DIAGNOSIS — J301 Allergic rhinitis due to pollen: Secondary | ICD-10-CM | POA: Diagnosis not present

## 2017-09-06 ENCOUNTER — Ambulatory Visit
Admission: RE | Admit: 2017-09-06 | Discharge: 2017-09-06 | Disposition: A | Discharge status: Home / Self Care | Payer: 59 | Source: Ambulatory Visit | Attending: Obstetrics and Gynecology | Admitting: Obstetrics and Gynecology

## 2017-09-06 DIAGNOSIS — Z1231 Encounter for screening mammogram for malignant neoplasm of breast: Secondary | ICD-10-CM | POA: Diagnosis not present

## 2017-09-06 DIAGNOSIS — Z139 Encounter for screening, unspecified: Secondary | ICD-10-CM

## 2017-09-07 ENCOUNTER — Other Ambulatory Visit: Payer: Self-pay | Admitting: Obstetrics and Gynecology

## 2017-09-07 DIAGNOSIS — R928 Other abnormal and inconclusive findings on diagnostic imaging of breast: Secondary | ICD-10-CM

## 2017-09-09 ENCOUNTER — Other Ambulatory Visit: Payer: Self-pay | Admitting: Obstetrics and Gynecology

## 2017-09-09 ENCOUNTER — Ambulatory Visit
Admission: RE | Admit: 2017-09-09 | Discharge: 2017-09-09 | Disposition: A | Discharge status: Home / Self Care | Payer: 59 | Source: Ambulatory Visit | Attending: Obstetrics and Gynecology | Admitting: Obstetrics and Gynecology

## 2017-09-09 DIAGNOSIS — R922 Inconclusive mammogram: Secondary | ICD-10-CM | POA: Diagnosis not present

## 2017-09-09 DIAGNOSIS — N6489 Other specified disorders of breast: Secondary | ICD-10-CM | POA: Diagnosis not present

## 2017-09-09 DIAGNOSIS — R928 Other abnormal and inconclusive findings on diagnostic imaging of breast: Secondary | ICD-10-CM

## 2017-09-12 DIAGNOSIS — J301 Allergic rhinitis due to pollen: Secondary | ICD-10-CM | POA: Diagnosis not present

## 2017-09-12 DIAGNOSIS — J3089 Other allergic rhinitis: Secondary | ICD-10-CM | POA: Diagnosis not present

## 2017-09-12 DIAGNOSIS — J3081 Allergic rhinitis due to animal (cat) (dog) hair and dander: Secondary | ICD-10-CM | POA: Diagnosis not present

## 2017-09-19 DIAGNOSIS — J3081 Allergic rhinitis due to animal (cat) (dog) hair and dander: Secondary | ICD-10-CM | POA: Diagnosis not present

## 2017-09-19 DIAGNOSIS — J301 Allergic rhinitis due to pollen: Secondary | ICD-10-CM | POA: Diagnosis not present

## 2017-09-19 DIAGNOSIS — J3089 Other allergic rhinitis: Secondary | ICD-10-CM | POA: Diagnosis not present

## 2017-09-21 DIAGNOSIS — Z0000 Encounter for general adult medical examination without abnormal findings: Secondary | ICD-10-CM | POA: Diagnosis not present

## 2017-09-21 DIAGNOSIS — R82998 Other abnormal findings in urine: Secondary | ICD-10-CM | POA: Diagnosis not present

## 2017-09-28 DIAGNOSIS — J3089 Other allergic rhinitis: Secondary | ICD-10-CM | POA: Diagnosis not present

## 2017-09-28 DIAGNOSIS — J3081 Allergic rhinitis due to animal (cat) (dog) hair and dander: Secondary | ICD-10-CM | POA: Diagnosis not present

## 2017-09-28 DIAGNOSIS — J301 Allergic rhinitis due to pollen: Secondary | ICD-10-CM | POA: Diagnosis not present

## 2017-10-03 DIAGNOSIS — J3081 Allergic rhinitis due to animal (cat) (dog) hair and dander: Secondary | ICD-10-CM | POA: Diagnosis not present

## 2017-10-03 DIAGNOSIS — J3089 Other allergic rhinitis: Secondary | ICD-10-CM | POA: Diagnosis not present

## 2017-10-03 DIAGNOSIS — J301 Allergic rhinitis due to pollen: Secondary | ICD-10-CM | POA: Diagnosis not present

## 2017-10-05 DIAGNOSIS — Z6821 Body mass index (BMI) 21.0-21.9, adult: Secondary | ICD-10-CM | POA: Diagnosis not present

## 2017-10-05 DIAGNOSIS — Z0000 Encounter for general adult medical examination without abnormal findings: Secondary | ICD-10-CM | POA: Diagnosis not present

## 2017-10-07 DIAGNOSIS — Z1212 Encounter for screening for malignant neoplasm of rectum: Secondary | ICD-10-CM | POA: Diagnosis not present

## 2017-10-10 DIAGNOSIS — J3089 Other allergic rhinitis: Secondary | ICD-10-CM | POA: Diagnosis not present

## 2017-10-10 DIAGNOSIS — J3081 Allergic rhinitis due to animal (cat) (dog) hair and dander: Secondary | ICD-10-CM | POA: Diagnosis not present

## 2017-10-10 DIAGNOSIS — J301 Allergic rhinitis due to pollen: Secondary | ICD-10-CM | POA: Diagnosis not present

## 2017-10-12 DIAGNOSIS — Z6822 Body mass index (BMI) 22.0-22.9, adult: Secondary | ICD-10-CM | POA: Diagnosis not present

## 2017-10-12 DIAGNOSIS — Z01419 Encounter for gynecological examination (general) (routine) without abnormal findings: Secondary | ICD-10-CM | POA: Diagnosis not present

## 2017-10-12 DIAGNOSIS — N951 Menopausal and female climacteric states: Secondary | ICD-10-CM | POA: Diagnosis not present

## 2017-10-17 DIAGNOSIS — J301 Allergic rhinitis due to pollen: Secondary | ICD-10-CM | POA: Diagnosis not present

## 2017-10-17 DIAGNOSIS — J3089 Other allergic rhinitis: Secondary | ICD-10-CM | POA: Diagnosis not present

## 2017-10-17 DIAGNOSIS — J3081 Allergic rhinitis due to animal (cat) (dog) hair and dander: Secondary | ICD-10-CM | POA: Diagnosis not present

## 2017-10-24 DIAGNOSIS — J3089 Other allergic rhinitis: Secondary | ICD-10-CM | POA: Diagnosis not present

## 2017-10-24 DIAGNOSIS — J301 Allergic rhinitis due to pollen: Secondary | ICD-10-CM | POA: Diagnosis not present

## 2017-10-24 DIAGNOSIS — J3081 Allergic rhinitis due to animal (cat) (dog) hair and dander: Secondary | ICD-10-CM | POA: Diagnosis not present

## 2017-10-31 DIAGNOSIS — J3089 Other allergic rhinitis: Secondary | ICD-10-CM | POA: Diagnosis not present

## 2017-10-31 DIAGNOSIS — J3081 Allergic rhinitis due to animal (cat) (dog) hair and dander: Secondary | ICD-10-CM | POA: Diagnosis not present

## 2017-10-31 DIAGNOSIS — J301 Allergic rhinitis due to pollen: Secondary | ICD-10-CM | POA: Diagnosis not present

## 2017-11-07 DIAGNOSIS — J3081 Allergic rhinitis due to animal (cat) (dog) hair and dander: Secondary | ICD-10-CM | POA: Diagnosis not present

## 2017-11-07 DIAGNOSIS — J3089 Other allergic rhinitis: Secondary | ICD-10-CM | POA: Diagnosis not present

## 2017-11-07 DIAGNOSIS — J301 Allergic rhinitis due to pollen: Secondary | ICD-10-CM | POA: Diagnosis not present

## 2017-11-14 DIAGNOSIS — J3081 Allergic rhinitis due to animal (cat) (dog) hair and dander: Secondary | ICD-10-CM | POA: Diagnosis not present

## 2017-11-14 DIAGNOSIS — J301 Allergic rhinitis due to pollen: Secondary | ICD-10-CM | POA: Diagnosis not present

## 2017-11-14 DIAGNOSIS — J3089 Other allergic rhinitis: Secondary | ICD-10-CM | POA: Diagnosis not present

## 2017-11-16 DIAGNOSIS — M79645 Pain in left finger(s): Secondary | ICD-10-CM | POA: Diagnosis not present

## 2017-11-16 DIAGNOSIS — M7712 Lateral epicondylitis, left elbow: Secondary | ICD-10-CM | POA: Diagnosis not present

## 2017-11-16 DIAGNOSIS — M25522 Pain in left elbow: Secondary | ICD-10-CM | POA: Diagnosis not present

## 2017-11-21 DIAGNOSIS — M25522 Pain in left elbow: Secondary | ICD-10-CM | POA: Diagnosis not present

## 2017-11-23 DIAGNOSIS — J3081 Allergic rhinitis due to animal (cat) (dog) hair and dander: Secondary | ICD-10-CM | POA: Diagnosis not present

## 2017-11-23 DIAGNOSIS — J3089 Other allergic rhinitis: Secondary | ICD-10-CM | POA: Diagnosis not present

## 2017-11-23 DIAGNOSIS — J301 Allergic rhinitis due to pollen: Secondary | ICD-10-CM | POA: Diagnosis not present

## 2017-12-05 DIAGNOSIS — J3089 Other allergic rhinitis: Secondary | ICD-10-CM | POA: Diagnosis not present

## 2017-12-05 DIAGNOSIS — J301 Allergic rhinitis due to pollen: Secondary | ICD-10-CM | POA: Diagnosis not present

## 2017-12-05 DIAGNOSIS — J3081 Allergic rhinitis due to animal (cat) (dog) hair and dander: Secondary | ICD-10-CM | POA: Diagnosis not present

## 2017-12-12 DIAGNOSIS — J3089 Other allergic rhinitis: Secondary | ICD-10-CM | POA: Diagnosis not present

## 2017-12-14 DIAGNOSIS — J3081 Allergic rhinitis due to animal (cat) (dog) hair and dander: Secondary | ICD-10-CM | POA: Diagnosis not present

## 2017-12-14 DIAGNOSIS — J301 Allergic rhinitis due to pollen: Secondary | ICD-10-CM | POA: Diagnosis not present

## 2017-12-14 DIAGNOSIS — J3089 Other allergic rhinitis: Secondary | ICD-10-CM | POA: Diagnosis not present

## 2017-12-19 DIAGNOSIS — J3081 Allergic rhinitis due to animal (cat) (dog) hair and dander: Secondary | ICD-10-CM | POA: Diagnosis not present

## 2017-12-19 DIAGNOSIS — J301 Allergic rhinitis due to pollen: Secondary | ICD-10-CM | POA: Diagnosis not present

## 2017-12-19 DIAGNOSIS — J3089 Other allergic rhinitis: Secondary | ICD-10-CM | POA: Diagnosis not present

## 2017-12-21 DIAGNOSIS — Z23 Encounter for immunization: Secondary | ICD-10-CM | POA: Diagnosis not present

## 2017-12-26 DIAGNOSIS — J3089 Other allergic rhinitis: Secondary | ICD-10-CM | POA: Diagnosis not present

## 2017-12-26 DIAGNOSIS — J301 Allergic rhinitis due to pollen: Secondary | ICD-10-CM | POA: Diagnosis not present

## 2017-12-26 DIAGNOSIS — J3081 Allergic rhinitis due to animal (cat) (dog) hair and dander: Secondary | ICD-10-CM | POA: Diagnosis not present

## 2018-01-03 DIAGNOSIS — J3081 Allergic rhinitis due to animal (cat) (dog) hair and dander: Secondary | ICD-10-CM | POA: Diagnosis not present

## 2018-01-03 DIAGNOSIS — J3089 Other allergic rhinitis: Secondary | ICD-10-CM | POA: Diagnosis not present

## 2018-01-03 DIAGNOSIS — J301 Allergic rhinitis due to pollen: Secondary | ICD-10-CM | POA: Diagnosis not present

## 2018-01-09 DIAGNOSIS — J3081 Allergic rhinitis due to animal (cat) (dog) hair and dander: Secondary | ICD-10-CM | POA: Diagnosis not present

## 2018-01-09 DIAGNOSIS — J3089 Other allergic rhinitis: Secondary | ICD-10-CM | POA: Diagnosis not present

## 2018-01-09 DIAGNOSIS — J301 Allergic rhinitis due to pollen: Secondary | ICD-10-CM | POA: Diagnosis not present

## 2018-01-16 DIAGNOSIS — J301 Allergic rhinitis due to pollen: Secondary | ICD-10-CM | POA: Diagnosis not present

## 2018-01-16 DIAGNOSIS — J3081 Allergic rhinitis due to animal (cat) (dog) hair and dander: Secondary | ICD-10-CM | POA: Diagnosis not present

## 2018-01-16 DIAGNOSIS — J3089 Other allergic rhinitis: Secondary | ICD-10-CM | POA: Diagnosis not present

## 2018-01-23 ENCOUNTER — Encounter: Payer: Self-pay | Admitting: Gastroenterology

## 2018-01-23 ENCOUNTER — Ambulatory Visit: Payer: 59 | Admitting: Gastroenterology

## 2018-01-23 VITALS — BP 128/80 | HR 76 | Ht 63.5 in | Wt 133.1 lb

## 2018-01-23 DIAGNOSIS — J3081 Allergic rhinitis due to animal (cat) (dog) hair and dander: Secondary | ICD-10-CM | POA: Diagnosis not present

## 2018-01-23 DIAGNOSIS — K648 Other hemorrhoids: Secondary | ICD-10-CM

## 2018-01-23 DIAGNOSIS — K219 Gastro-esophageal reflux disease without esophagitis: Secondary | ICD-10-CM

## 2018-01-23 DIAGNOSIS — J301 Allergic rhinitis due to pollen: Secondary | ICD-10-CM | POA: Diagnosis not present

## 2018-01-23 DIAGNOSIS — J3089 Other allergic rhinitis: Secondary | ICD-10-CM | POA: Diagnosis not present

## 2018-01-23 NOTE — Progress Notes (Signed)
    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  She relates a small amount of bright red blood on the tissue paper after wiping.  She denies anal or perianal discomfort, itching or recurrent bleeding.  She noted small anal area abnormalities that she would like evaluated.  She has chronic GERD that is relatively well controlled on omeprazole 20 mg every other day however on the off days she does have some bloating and dyspeptic symptoms.  Colonoscopy 01/2016 showed small internal hemorrhoids and 2 polyps (lymphoid tissue). She was diagnosed with GERD, eosinophilic esophagitis and Barrett's esophagus per evaluation by Dr. Collene Mares in 2017.  She was evaluated by Dr. Adria Devon at [facility] and underwent EGD in 03/2016 that did not show Barrett's and esophageal biopsies were normal.  She was advised to discontinue treatment for eosinophilic esophagitis and remain on a PPI for GERD mgmt which she has done.  Current Medications, Allergies, Past Medical History, Past Surgical History, Family History and Social History were reviewed in [employer] record.  Physical Exam: General: Well developed, well nourished, no acute distress Head: Normocephalic and atraumatic Eyes:  sclerae anicteric, EOMI Ears: Normal auditory acuity Mouth: No deformity or lesions Lungs: Clear throughout to auscultation Heart: Regular rate and rhythm; no murmurs, rubs or bruits Abdomen: Soft, non tender and non distended. No masses, hepatosplenomegaly or hernias noted. Normal Bowel sounds Rectal: Skin tags anteriorly and posteriorly there is a smooth, rounded nontender anal canal abnormality consistent with a small internal hemorrhoid, no internal lesions noted on DRE, brown Hemoccult negative stool in the vault Musculoskeletal: Symmetrical with no gross deformities  Pulses:  Normal pulses noted Extremities: No clubbing, cyanosis, edema or deformities  noted Neurological: Alert oriented x 4, grossly nonfocal Psychological:  Alert and cooperative. Normal mood and affect  Assessment and Recommendations:  1. GERD.  Barrett's and eosinophilic esophagitis effectively ruled out after evaluation at [facility] by Dr. Adria Devon.  Follow standard antireflux measures.  Offered her options of continuing omeprazole 20 mg every other day, changing to omeprazole 20 mg daily or changing to ranitidine 300 mg once daily or twice daily.  She prefers trial of ranitidine.  If this it is not effective she will resume omeprazole.   2. Internal hemorrhoid, external skin tags.  One episode of scant hematochezia likely related to internal hemorrhoids.  Given colonoscopy performed 2 years ago no plans to repeat at this time.  Reassurance provided.  Baby wipes recommended if bleeding, anal discomfort or anal irritation develop.

## 2018-01-23 NOTE — Patient Instructions (Signed)
Start ranitidine 300 mg 1-2 x daily in place of your omeprazole.   Follow up with Dr. Fuller Plan as needed.   Normal BMI (Body Mass Index- based on height and weight) is between 19 and 25. Your BMI today is Body mass index is 23.21 kg/m. Marland Kitchen Please consider follow up  regarding your BMI with your Primary Care Provider.  Thank you for choosing me and [facility].  Pricilla Riffle. Dagoberto Ligas., MD., Marval Regal

## 2018-01-30 DIAGNOSIS — J3081 Allergic rhinitis due to animal (cat) (dog) hair and dander: Secondary | ICD-10-CM | POA: Diagnosis not present

## 2018-01-30 DIAGNOSIS — J301 Allergic rhinitis due to pollen: Secondary | ICD-10-CM | POA: Diagnosis not present

## 2018-01-30 DIAGNOSIS — J3089 Other allergic rhinitis: Secondary | ICD-10-CM | POA: Diagnosis not present

## 2018-02-02 DIAGNOSIS — M545 Low back pain: Secondary | ICD-10-CM | POA: Diagnosis not present

## 2018-02-06 DIAGNOSIS — J3081 Allergic rhinitis due to animal (cat) (dog) hair and dander: Secondary | ICD-10-CM | POA: Diagnosis not present

## 2018-02-06 DIAGNOSIS — J3089 Other allergic rhinitis: Secondary | ICD-10-CM | POA: Diagnosis not present

## 2018-02-06 DIAGNOSIS — J301 Allergic rhinitis due to pollen: Secondary | ICD-10-CM | POA: Diagnosis not present

## 2018-02-13 DIAGNOSIS — J3081 Allergic rhinitis due to animal (cat) (dog) hair and dander: Secondary | ICD-10-CM | POA: Diagnosis not present

## 2018-02-13 DIAGNOSIS — J301 Allergic rhinitis due to pollen: Secondary | ICD-10-CM | POA: Diagnosis not present

## 2018-02-13 DIAGNOSIS — J3089 Other allergic rhinitis: Secondary | ICD-10-CM | POA: Diagnosis not present

## 2018-02-20 DIAGNOSIS — J3089 Other allergic rhinitis: Secondary | ICD-10-CM | POA: Diagnosis not present

## 2018-02-20 DIAGNOSIS — J3081 Allergic rhinitis due to animal (cat) (dog) hair and dander: Secondary | ICD-10-CM | POA: Diagnosis not present

## 2018-02-20 DIAGNOSIS — J301 Allergic rhinitis due to pollen: Secondary | ICD-10-CM | POA: Diagnosis not present

## 2018-02-22 DIAGNOSIS — J301 Allergic rhinitis due to pollen: Secondary | ICD-10-CM | POA: Diagnosis not present

## 2018-02-22 DIAGNOSIS — J3081 Allergic rhinitis due to animal (cat) (dog) hair and dander: Secondary | ICD-10-CM | POA: Diagnosis not present

## 2018-02-24 NOTE — Telephone Encounter (Signed)
Error

## 2018-02-27 DIAGNOSIS — J3081 Allergic rhinitis due to animal (cat) (dog) hair and dander: Secondary | ICD-10-CM | POA: Diagnosis not present

## 2018-02-27 DIAGNOSIS — J3089 Other allergic rhinitis: Secondary | ICD-10-CM | POA: Diagnosis not present

## 2018-02-27 DIAGNOSIS — J301 Allergic rhinitis due to pollen: Secondary | ICD-10-CM | POA: Diagnosis not present

## 2018-03-06 DIAGNOSIS — J3081 Allergic rhinitis due to animal (cat) (dog) hair and dander: Secondary | ICD-10-CM | POA: Diagnosis not present

## 2018-03-06 DIAGNOSIS — J301 Allergic rhinitis due to pollen: Secondary | ICD-10-CM | POA: Diagnosis not present

## 2018-03-06 DIAGNOSIS — J3089 Other allergic rhinitis: Secondary | ICD-10-CM | POA: Diagnosis not present

## 2018-03-13 DIAGNOSIS — J301 Allergic rhinitis due to pollen: Secondary | ICD-10-CM | POA: Diagnosis not present

## 2018-03-13 DIAGNOSIS — J3089 Other allergic rhinitis: Secondary | ICD-10-CM | POA: Diagnosis not present

## 2018-03-13 DIAGNOSIS — J3081 Allergic rhinitis due to animal (cat) (dog) hair and dander: Secondary | ICD-10-CM | POA: Diagnosis not present

## 2018-03-20 DIAGNOSIS — J3081 Allergic rhinitis due to animal (cat) (dog) hair and dander: Secondary | ICD-10-CM | POA: Diagnosis not present

## 2018-03-20 DIAGNOSIS — J3089 Other allergic rhinitis: Secondary | ICD-10-CM | POA: Diagnosis not present

## 2018-03-20 DIAGNOSIS — J301 Allergic rhinitis due to pollen: Secondary | ICD-10-CM | POA: Diagnosis not present

## 2018-03-24 DIAGNOSIS — L570 Actinic keratosis: Secondary | ICD-10-CM | POA: Diagnosis not present

## 2018-03-27 DIAGNOSIS — J301 Allergic rhinitis due to pollen: Secondary | ICD-10-CM | POA: Diagnosis not present

## 2018-03-27 DIAGNOSIS — J3081 Allergic rhinitis due to animal (cat) (dog) hair and dander: Secondary | ICD-10-CM | POA: Diagnosis not present

## 2018-03-27 DIAGNOSIS — J3089 Other allergic rhinitis: Secondary | ICD-10-CM | POA: Diagnosis not present

## 2018-04-03 DIAGNOSIS — J3081 Allergic rhinitis due to animal (cat) (dog) hair and dander: Secondary | ICD-10-CM | POA: Diagnosis not present

## 2018-04-03 DIAGNOSIS — J301 Allergic rhinitis due to pollen: Secondary | ICD-10-CM | POA: Diagnosis not present

## 2018-04-03 DIAGNOSIS — J3089 Other allergic rhinitis: Secondary | ICD-10-CM | POA: Diagnosis not present

## 2018-04-10 DIAGNOSIS — J3081 Allergic rhinitis due to animal (cat) (dog) hair and dander: Secondary | ICD-10-CM | POA: Diagnosis not present

## 2018-04-10 DIAGNOSIS — J3089 Other allergic rhinitis: Secondary | ICD-10-CM | POA: Diagnosis not present

## 2018-04-10 DIAGNOSIS — J301 Allergic rhinitis due to pollen: Secondary | ICD-10-CM | POA: Diagnosis not present

## 2018-04-16 ENCOUNTER — Other Ambulatory Visit: Payer: Self-pay | Admitting: Internal Medicine

## 2018-04-17 DIAGNOSIS — J3081 Allergic rhinitis due to animal (cat) (dog) hair and dander: Secondary | ICD-10-CM | POA: Diagnosis not present

## 2018-04-17 DIAGNOSIS — J301 Allergic rhinitis due to pollen: Secondary | ICD-10-CM | POA: Diagnosis not present

## 2018-04-17 DIAGNOSIS — J3089 Other allergic rhinitis: Secondary | ICD-10-CM | POA: Diagnosis not present

## 2018-04-20 DIAGNOSIS — R05 Cough: Secondary | ICD-10-CM | POA: Diagnosis not present

## 2018-04-20 DIAGNOSIS — M7711 Lateral epicondylitis, right elbow: Secondary | ICD-10-CM | POA: Diagnosis not present

## 2018-04-20 DIAGNOSIS — M7712 Lateral epicondylitis, left elbow: Secondary | ICD-10-CM | POA: Diagnosis not present

## 2018-04-24 DIAGNOSIS — J3089 Other allergic rhinitis: Secondary | ICD-10-CM | POA: Diagnosis not present

## 2018-04-24 DIAGNOSIS — J3081 Allergic rhinitis due to animal (cat) (dog) hair and dander: Secondary | ICD-10-CM | POA: Diagnosis not present

## 2018-04-24 DIAGNOSIS — J301 Allergic rhinitis due to pollen: Secondary | ICD-10-CM | POA: Diagnosis not present

## 2018-05-08 DIAGNOSIS — J3089 Other allergic rhinitis: Secondary | ICD-10-CM | POA: Diagnosis not present

## 2018-05-08 DIAGNOSIS — J3081 Allergic rhinitis due to animal (cat) (dog) hair and dander: Secondary | ICD-10-CM | POA: Diagnosis not present

## 2018-05-08 DIAGNOSIS — J301 Allergic rhinitis due to pollen: Secondary | ICD-10-CM | POA: Diagnosis not present

## 2018-05-15 DIAGNOSIS — J3089 Other allergic rhinitis: Secondary | ICD-10-CM | POA: Diagnosis not present

## 2018-05-15 DIAGNOSIS — J3081 Allergic rhinitis due to animal (cat) (dog) hair and dander: Secondary | ICD-10-CM | POA: Diagnosis not present

## 2018-05-15 DIAGNOSIS — J301 Allergic rhinitis due to pollen: Secondary | ICD-10-CM | POA: Diagnosis not present

## 2018-05-22 DIAGNOSIS — J3081 Allergic rhinitis due to animal (cat) (dog) hair and dander: Secondary | ICD-10-CM | POA: Diagnosis not present

## 2018-05-22 DIAGNOSIS — J3089 Other allergic rhinitis: Secondary | ICD-10-CM | POA: Diagnosis not present

## 2018-05-22 DIAGNOSIS — J301 Allergic rhinitis due to pollen: Secondary | ICD-10-CM | POA: Diagnosis not present

## 2018-05-29 DIAGNOSIS — J301 Allergic rhinitis due to pollen: Secondary | ICD-10-CM | POA: Diagnosis not present

## 2018-05-29 DIAGNOSIS — J3081 Allergic rhinitis due to animal (cat) (dog) hair and dander: Secondary | ICD-10-CM | POA: Diagnosis not present

## 2018-05-29 DIAGNOSIS — J3089 Other allergic rhinitis: Secondary | ICD-10-CM | POA: Diagnosis not present

## 2018-05-30 ENCOUNTER — Ambulatory Visit: Payer: 59 | Admitting: Gastroenterology

## 2018-06-05 DIAGNOSIS — J3081 Allergic rhinitis due to animal (cat) (dog) hair and dander: Secondary | ICD-10-CM | POA: Diagnosis not present

## 2018-06-05 DIAGNOSIS — J301 Allergic rhinitis due to pollen: Secondary | ICD-10-CM | POA: Diagnosis not present

## 2018-06-05 DIAGNOSIS — J3089 Other allergic rhinitis: Secondary | ICD-10-CM | POA: Diagnosis not present

## 2018-06-08 DIAGNOSIS — J3089 Other allergic rhinitis: Secondary | ICD-10-CM | POA: Diagnosis not present

## 2018-06-12 DIAGNOSIS — J3081 Allergic rhinitis due to animal (cat) (dog) hair and dander: Secondary | ICD-10-CM | POA: Diagnosis not present

## 2018-06-12 DIAGNOSIS — J3089 Other allergic rhinitis: Secondary | ICD-10-CM | POA: Diagnosis not present

## 2018-06-12 DIAGNOSIS — J301 Allergic rhinitis due to pollen: Secondary | ICD-10-CM | POA: Diagnosis not present

## 2018-06-19 DIAGNOSIS — J3089 Other allergic rhinitis: Secondary | ICD-10-CM | POA: Diagnosis not present

## 2018-06-19 DIAGNOSIS — J301 Allergic rhinitis due to pollen: Secondary | ICD-10-CM | POA: Diagnosis not present

## 2018-06-19 DIAGNOSIS — J3081 Allergic rhinitis due to animal (cat) (dog) hair and dander: Secondary | ICD-10-CM | POA: Diagnosis not present

## 2018-06-26 DIAGNOSIS — J301 Allergic rhinitis due to pollen: Secondary | ICD-10-CM | POA: Diagnosis not present

## 2018-06-26 DIAGNOSIS — J3081 Allergic rhinitis due to animal (cat) (dog) hair and dander: Secondary | ICD-10-CM | POA: Diagnosis not present

## 2018-06-26 DIAGNOSIS — J3089 Other allergic rhinitis: Secondary | ICD-10-CM | POA: Diagnosis not present

## 2018-07-03 DIAGNOSIS — J301 Allergic rhinitis due to pollen: Secondary | ICD-10-CM | POA: Diagnosis not present

## 2018-07-03 DIAGNOSIS — J3089 Other allergic rhinitis: Secondary | ICD-10-CM | POA: Diagnosis not present

## 2018-07-03 DIAGNOSIS — J3081 Allergic rhinitis due to animal (cat) (dog) hair and dander: Secondary | ICD-10-CM | POA: Diagnosis not present

## 2018-07-07 ENCOUNTER — Ambulatory Visit: Payer: 59 | Admitting: Internal Medicine

## 2018-07-07 ENCOUNTER — Encounter: Payer: Self-pay | Admitting: Internal Medicine

## 2018-07-07 VITALS — BP 134/80 | HR 58 | Ht 63.5 in | Wt 131.8 lb

## 2018-07-07 DIAGNOSIS — Q211 Atrial septal defect: Secondary | ICD-10-CM

## 2018-07-07 DIAGNOSIS — R002 Palpitations: Secondary | ICD-10-CM

## 2018-07-07 DIAGNOSIS — Q2112 Patent foramen ovale: Secondary | ICD-10-CM

## 2018-07-07 NOTE — Patient Instructions (Signed)
Medication Instructions:  No changes If you need a refill on your cardiac medications before your next appointment, please call your pharmacy.   Lab work: none If you have labs (blood work) drawn today and your tests are completely normal, you will receive your results only by: Marland Kitchen MyChart Message (if you have MyChart) OR . A paper copy in the mail If you have any lab test that is abnormal or we need to change your treatment, we will call you to review the results.  Testing/Procedures: Your physician has recommended that you wear an event monitor. Event monitors are medical devices that record the heart's electrical activity. Doctors most often [place] these monitors to diagnose arrhythmias. Arrhythmias are problems with the speed or rhythm of the heartbeat. The monitor is a small, portable device. You can wear one while you do your normal daily activities. This is usually used to diagnose what is causing palpitations/syncope (passing out).   Follow-Up: At [facility], you and your health needs are our priority.  As part of our continuing mission to provide you with exceptional heart care, we have created designated Provider Care Teams.  These Care Teams include your primary Cardiologist (physician) and Advanced Practice Providers (APPs -  Physician Assistants and Nurse Practitioners) who all work together to provide you with the care you need, when you need it. You will need a follow up appointment in:  12 months.  Please call our office 2 months in advance to schedule this appointment.  You may see [name], MD or one of the following Advanced Practice Providers on your designated Care Team: Richardson Dopp, PA-C [place], [place] . Daune Perch, NP  Any Other Special Instructions Will Be Listed Below (If Applicable).

## 2018-07-07 NOTE — Progress Notes (Signed)
Cardiology Office Note   Date:  07/07/2018   ID:  [name], DOB May 29, 1970, MRN [id number]  PCP:  Burnard Bunting, MD  Cardiologist:   [name], MD    F/U of palpitations and PFO     History of Present Illness: [name] is a 48 y.o. female with a history of palpitations    She was last in clinic in Jan 2019   Seen by Geannie Risen at the time    I reviewed and followed her since then    With palpitations she was set up for an echo      She had an echo done that showed normal LV and RV function  Septum was noted to be hypermobile and PFO was suspected   Bubble study confirmed that a large PFO was present   Pt went on to have a CT coronary angiogram.   Heart size was normal  Calcium score was 0    Aorta noraml    Coronry arteries normal, R dominant   Pulm venous drainage was normal   Small PFO noted   No evid for pulmonary HTN   Since seen she remains active   Runs 5 miles a few times per week   Would like to do a 1/2 Iron Man She still has intermitt palpitations   No association with particular activity or time    Has gotten I watch recordings      Some strips difficult to analyze   Sudden loss of wave forem  No severe dizziness   Current Meds  Medication Sig  . aspirin EC 81 MG tablet Take 81 mg by mouth daily.  Marland Kitchen BIOTIN PO Take 1 capsule by mouth daily.  . calcium-vitamin D (OSCAL WITH D) 500-200 MG-UNIT per tablet Take 1 tablet by mouth 2 (two) times daily.  . fish oil-omega-3 fatty acids 1000 MG capsule Take 1 g by mouth daily.  . Glucosamine-Chondroitin 250-200 MG TABS Take by mouth.  . Lactobacillus-Inulin ([place] PO) Take by mouth.  . levocetirizine (XYZAL) 5 MG tablet Take 2.5 mg by mouth every evening.   . Multiple Vitamin (MULTIVITAMIN WITH MINERALS) TABS Take 1 tablet by mouth daily.  Marland Kitchen omeprazole (PRILOSEC) 20 MG capsule Take 20 mg by mouth daily.  . Psyllium-Calcium (METAMUCIL PLUS CALCIUM) CAPS Take by mouth.     Allergies:    Elba Barman   Past Medical History:  Diagnosis Date  . Barrett's syndrome   . Gastric polyp   . Hiatal hernia   . HNP (herniated nucleus pulposus with myelopathy), thoracic   . Lumbar disc disease   . Lumbar spondylosis   . PFO (patent foramen ovale)     Past Surgical History:  Procedure Laterality Date  . CESAREAN SECTION  2007, 2009   x 2  . HEMILAMINOTOMY LUMBAR SPINE  09/2003  . KNEE ARTHROSCOPY Right   . MICRODISCECTOMY LUMBAR     L5-S1  . TUBAL LIGATION       Social History:  The patient  reports that she has never smoked. She has never used smokeless tobacco. She reports current alcohol use. She reports that she does not use drugs.   Family History:  The patient's family history includes Heart disease in her father; Hypertension in her father; Leukemia in her mother.    ROS:  Please see the history of present illness. All other systems are reviewed and  Negative to the above problem except as noted.  PHYSICAL EXAM: VS:  BP 134/80   Pulse (!) 58   Ht 5' 3.5" (1.613 m)   Wt 131 lb 12.8 oz (59.8 kg)   BMI 22.98 kg/m   GEN: Well nourished, well developed, in no acute distress  HEENT: normal  Neck: JVP is normal  No  carotid bruits, or masses Cardiac: RRR; no murmurs, rubs, or gallops,no edema  Respiratory:  clear to auscultation bilaterally, normal work of breathing GI: soft, nontender, nondistended, + BS  No hepatomegaly  MS: no deformity Moving all extremities   Skin: warm and dry, no rash Neuro:  Strength and sensation are intact Psych: euthymic mood, full affect   EKG:  EKG is ordered today.  58 bpm      Lipid Panel No results found for: CHOL, TRIG, HDL, CHOLHDL, VLDL, LDLCALC, LDLDIRECT    Wt Readings from Last 3 Encounters:  07/07/18 131 lb 12.8 oz (59.8 kg)  01/23/18 133 lb 2 oz (60.4 kg)  05/19/17 130 lb (59 kg)      ASSESSMENT AND PLAN:  1  Palpitations    I have reviewed strips   Some are difficult to read, or unreadable   Not clear  why   I have asked her to switch wrists to see if contact/transmission is better Would also set up for a monitor to capture Stay hydreated  2   PFO    I have reviewed echo images with her .    Bubble study shows rel large PFO   WOuld keep on ASA   I am not convincied of RV enlargement    I will review with structural team.  (not she had head CT in past (2012) that was normal   No CVA) I do think it is early to recheck echo   Would defer for now until next year.   May reeval after discussion  Reviewed echo images extensively   Answered questions    30 min spent for discussion   3  HCM   LIpids are very good   LDL 83  HDL 77 I have encouraged pt to stay active    Can slowly train for 1/2 iron man   Stay hydrated!      Current medicines are reviewed at length with the patient today.  The patient does not have concerns regarding medicines.  Signed, [name], MD  07/07/2018 8:36 AM    [address], [place], [place]  [address] Phone: [phone]; Fax: [phone]

## 2018-07-10 DIAGNOSIS — J3089 Other allergic rhinitis: Secondary | ICD-10-CM | POA: Diagnosis not present

## 2018-07-10 DIAGNOSIS — J3081 Allergic rhinitis due to animal (cat) (dog) hair and dander: Secondary | ICD-10-CM | POA: Diagnosis not present

## 2018-07-10 DIAGNOSIS — J301 Allergic rhinitis due to pollen: Secondary | ICD-10-CM | POA: Diagnosis not present

## 2018-07-17 ENCOUNTER — Encounter: Payer: Self-pay | Admitting: Gastroenterology

## 2018-07-17 ENCOUNTER — Ambulatory Visit: Payer: 59 | Admitting: Gastroenterology

## 2018-07-17 ENCOUNTER — Other Ambulatory Visit: Payer: Self-pay

## 2018-07-17 VITALS — BP 126/86 | HR 58 | Temp 98.5°F | Ht 63.25 in | Wt 130.1 lb

## 2018-07-17 DIAGNOSIS — K600 Acute anal fissure: Secondary | ICD-10-CM | POA: Diagnosis not present

## 2018-07-17 DIAGNOSIS — J3081 Allergic rhinitis due to animal (cat) (dog) hair and dander: Secondary | ICD-10-CM | POA: Diagnosis not present

## 2018-07-17 DIAGNOSIS — K625 Hemorrhage of anus and rectum: Secondary | ICD-10-CM | POA: Diagnosis not present

## 2018-07-17 DIAGNOSIS — J301 Allergic rhinitis due to pollen: Secondary | ICD-10-CM | POA: Diagnosis not present

## 2018-07-17 DIAGNOSIS — J3089 Other allergic rhinitis: Secondary | ICD-10-CM | POA: Diagnosis not present

## 2018-07-17 MED ORDER — DILTIAZEM GEL 2 %: 1.0000 "application " | Freq: Three times a day (TID) | CUTANEOUS | 2 refills | Status: DC

## 2018-07-17 NOTE — Patient Instructions (Signed)
We have sent a prescription for diltiazem gel to [facility]. You should apply a pea size amount to your rectum three times daily x 8 weeks.  [facility]'s information is below: Address: [address] [place], [place] [address]  [phone]  *Please DO NOT go directly from our office to pick up this medication! Give the pharmacy 1 day to process the prescription as this is compounded at takes time to make.  You can use over the counter preperation H cream or recti-care cream as needed.   To help prevent the possible spread of infection to our patients, communities, and staff; we will be implementing the following measures:  Please only allow one visitor/family member to accompany you to any upcoming appointments with [facility]. If you have any concerns about this please contact our office to discuss prior to the appointment.   Thank you for choosing me and [facility].  Pricilla Riffle. Dagoberto Ligas., MD., Marval Regal

## 2018-07-17 NOTE — Progress Notes (Signed)
    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  She states her symptoms began about 2 weeks ago and although the rectal pain and rectal bleeding has improved her symptoms persist.  She had scant amounts of bright red blood per rectum in early 2019 and an office visit at that time she had external skin tags and a small internal hemorrhoid.  She denies straining with bowel movements.  She runs long distances frequently.  Denies weight loss, abdominal pain, constipation, diarrhea, change in stool caliber, melena, nausea, vomiting, dysphagia, reflux symptoms, chest pain.   Colonoscopy 01/2016 - Two 4 to 5 mm polyps in the cecum. (polypoid mucosa) - Internal hemorrhoids. - The examination was otherwise normal on direct and retroflexion views.  Current Medications, Allergies, Past Medical History, Past Surgical History, Family History and Social History were reviewed in [employer] record.  Physical Exam: General: Well developed, well nourished, no acute distress Head: Normocephalic and atraumatic Eyes:  sclerae anicteric, EOMI Ears: Normal auditory acuity Mouth: No deformity or lesions Lungs: Clear throughout to auscultation Heart: Regular rate and rhythm; no murmurs, rubs or bruits Abdomen: Soft, non tender and non distended. No masses, hepatosplenomegaly or hernias noted. Normal Bowel sounds Rectal: anterior anal fissure, mild anal canal tenderness, small hemorrhoid, small skin tags Musculoskeletal: Symmetrical with no gross deformities  Pulses:  Normal pulses noted Extremities: No clubbing, cyanosis, edema or deformities noted Neurological: Alert oriented x 4, grossly nonfocal Psychological:  Alert and cooperative. Normal mood and affect   Assessment and Recommendations:  1.  Anal fissure, hemorrhoids.  Anal fissure appears to be the active problem at this time.  Begin diltiazem 2% gel 3 times  daily and apply to anal area for 8 weeks.  Preparation H twice daily as needed RectiCare twice daily as needed.  If symptoms persist consider endoscopy or colonoscopy.  After her anal fissure has completely healed if hemorrhoidal symptoms persist consider hemorrhoidal banding.  REV in 2 months.

## 2018-07-18 ENCOUNTER — Telehealth: Payer: Self-pay | Admitting: Internal Medicine

## 2018-07-18 NOTE — Telephone Encounter (Signed)
Spoke to patient    I will call her when corona virus situation improves to sched monitor Cancel pick up for tomorrow 3/18

## 2018-07-24 DIAGNOSIS — J301 Allergic rhinitis due to pollen: Secondary | ICD-10-CM | POA: Diagnosis not present

## 2018-07-24 DIAGNOSIS — J3089 Other allergic rhinitis: Secondary | ICD-10-CM | POA: Diagnosis not present

## 2018-07-24 DIAGNOSIS — J3081 Allergic rhinitis due to animal (cat) (dog) hair and dander: Secondary | ICD-10-CM | POA: Diagnosis not present

## 2018-07-31 DIAGNOSIS — J3089 Other allergic rhinitis: Secondary | ICD-10-CM | POA: Diagnosis not present

## 2018-07-31 DIAGNOSIS — J3081 Allergic rhinitis due to animal (cat) (dog) hair and dander: Secondary | ICD-10-CM | POA: Diagnosis not present

## 2018-07-31 DIAGNOSIS — J301 Allergic rhinitis due to pollen: Secondary | ICD-10-CM | POA: Diagnosis not present

## 2018-08-01 ENCOUNTER — Telehealth: Payer: Self-pay | Admitting: Radiology

## 2018-08-01 NOTE — Telephone Encounter (Signed)
Event monitor was enrolled to be mailed to patient on 3/31 due to Covid-19. Patient knows to expect a call from [employer] and she will receive the monitor in 5-7 days

## 2018-08-03 ENCOUNTER — Other Ambulatory Visit: Payer: Self-pay | Admitting: Obstetrics and Gynecology

## 2018-08-03 DIAGNOSIS — Z1231 Encounter for screening mammogram for malignant neoplasm of breast: Secondary | ICD-10-CM

## 2018-08-04 ENCOUNTER — Encounter (INDEPENDENT_AMBULATORY_CARE_PROVIDER_SITE_OTHER): Payer: 59

## 2018-08-04 DIAGNOSIS — R002 Palpitations: Secondary | ICD-10-CM | POA: Diagnosis not present

## 2018-08-07 DIAGNOSIS — J301 Allergic rhinitis due to pollen: Secondary | ICD-10-CM | POA: Diagnosis not present

## 2018-08-07 DIAGNOSIS — J3089 Other allergic rhinitis: Secondary | ICD-10-CM | POA: Diagnosis not present

## 2018-08-07 DIAGNOSIS — J3081 Allergic rhinitis due to animal (cat) (dog) hair and dander: Secondary | ICD-10-CM | POA: Diagnosis not present

## 2018-08-14 DIAGNOSIS — J3081 Allergic rhinitis due to animal (cat) (dog) hair and dander: Secondary | ICD-10-CM | POA: Diagnosis not present

## 2018-08-14 DIAGNOSIS — J301 Allergic rhinitis due to pollen: Secondary | ICD-10-CM | POA: Diagnosis not present

## 2018-08-14 DIAGNOSIS — J3089 Other allergic rhinitis: Secondary | ICD-10-CM | POA: Diagnosis not present

## 2018-08-15 NOTE — Telephone Encounter (Signed)
Monitor was enrolled to be mailed on 3-31

## 2018-08-17 DIAGNOSIS — J3081 Allergic rhinitis due to animal (cat) (dog) hair and dander: Secondary | ICD-10-CM | POA: Diagnosis not present

## 2018-08-17 DIAGNOSIS — J301 Allergic rhinitis due to pollen: Secondary | ICD-10-CM | POA: Diagnosis not present

## 2018-08-17 DIAGNOSIS — J3089 Other allergic rhinitis: Secondary | ICD-10-CM | POA: Diagnosis not present

## 2018-08-21 DIAGNOSIS — J3081 Allergic rhinitis due to animal (cat) (dog) hair and dander: Secondary | ICD-10-CM | POA: Diagnosis not present

## 2018-08-21 DIAGNOSIS — J301 Allergic rhinitis due to pollen: Secondary | ICD-10-CM | POA: Diagnosis not present

## 2018-08-21 DIAGNOSIS — J3089 Other allergic rhinitis: Secondary | ICD-10-CM | POA: Diagnosis not present

## 2018-08-24 DIAGNOSIS — D220 Melanocytic nevi of lip: Secondary | ICD-10-CM | POA: Diagnosis not present

## 2018-08-24 DIAGNOSIS — D2262 Melanocytic nevi of left upper limb, including shoulder: Secondary | ICD-10-CM | POA: Diagnosis not present

## 2018-08-24 DIAGNOSIS — D2261 Melanocytic nevi of right upper limb, including shoulder: Secondary | ICD-10-CM | POA: Diagnosis not present

## 2018-08-28 DIAGNOSIS — J301 Allergic rhinitis due to pollen: Secondary | ICD-10-CM | POA: Diagnosis not present

## 2018-08-28 DIAGNOSIS — J3081 Allergic rhinitis due to animal (cat) (dog) hair and dander: Secondary | ICD-10-CM | POA: Diagnosis not present

## 2018-08-28 DIAGNOSIS — J3089 Other allergic rhinitis: Secondary | ICD-10-CM | POA: Diagnosis not present

## 2018-09-04 DIAGNOSIS — J3089 Other allergic rhinitis: Secondary | ICD-10-CM | POA: Diagnosis not present

## 2018-09-04 DIAGNOSIS — J3081 Allergic rhinitis due to animal (cat) (dog) hair and dander: Secondary | ICD-10-CM | POA: Diagnosis not present

## 2018-09-04 DIAGNOSIS — J301 Allergic rhinitis due to pollen: Secondary | ICD-10-CM | POA: Diagnosis not present

## 2018-09-05 ENCOUNTER — Other Ambulatory Visit: Payer: Self-pay

## 2018-09-11 DIAGNOSIS — J3081 Allergic rhinitis due to animal (cat) (dog) hair and dander: Secondary | ICD-10-CM | POA: Diagnosis not present

## 2018-09-11 DIAGNOSIS — J3089 Other allergic rhinitis: Secondary | ICD-10-CM | POA: Diagnosis not present

## 2018-09-11 DIAGNOSIS — J301 Allergic rhinitis due to pollen: Secondary | ICD-10-CM | POA: Diagnosis not present

## 2018-09-27 ENCOUNTER — Ambulatory Visit
Admission: RE | Admit: 2018-09-27 | Discharge: 2018-09-27 | Disposition: A | Discharge status: Home / Self Care | Payer: 59 | Source: Ambulatory Visit | Attending: Obstetrics and Gynecology | Admitting: Obstetrics and Gynecology

## 2018-09-27 ENCOUNTER — Other Ambulatory Visit: Payer: Self-pay

## 2018-09-27 DIAGNOSIS — Z1231 Encounter for screening mammogram for malignant neoplasm of breast: Secondary | ICD-10-CM | POA: Diagnosis not present

## 2018-10-06 ENCOUNTER — Telehealth: Payer: Self-pay | Admitting: Internal Medicine

## 2018-10-06 NOTE — Telephone Encounter (Signed)
Spoke to patient about last echo and CT scan   REcomm she have a TEE to evaluate interatrial septum more closely    WIll do with Liane Comber on July 1 (Wed) Please contact pt to arrange

## 2018-10-06 NOTE — Telephone Encounter (Signed)
TEE scheduled with [name] for 11/01/18 with Dr. Meda Coffee at 8:30am... Pt to arrive at 7:30am. She verbalized understating of the instructions and will call if she has any problems prior to the procedure.   Case [phone]

## 2018-10-30 ENCOUNTER — Ambulatory Visit: Payer: Self-pay

## 2018-10-30 ENCOUNTER — Other Ambulatory Visit: Payer: Self-pay

## 2018-10-30 ENCOUNTER — Telehealth: Payer: Self-pay | Admitting: *Deleted

## 2018-10-30 ENCOUNTER — Other Ambulatory Visit: Payer: 59

## 2018-10-30 DIAGNOSIS — Z01812 Encounter for preprocedural laboratory examination: Secondary | ICD-10-CM

## 2018-10-30 DIAGNOSIS — Q211 Atrial septal defect: Secondary | ICD-10-CM

## 2018-10-30 DIAGNOSIS — Q2112 Patent foramen ovale: Secondary | ICD-10-CM

## 2018-10-30 NOTE — Telephone Encounter (Signed)
Spoke with patient.  Arranged for covid screening to be done tomorrow.   Results to be back by 7 am Wed per Evonnie Pat [phone]) Pt is aware that an appointment time has been scheduled but per [facility], as long as she is in the drive up line by 1:03 pm, they will test her.  She will self isolate after that until coming to hospital Wed am for TEE.  Will try to come today to our office for labs (cbc, bmet).  If unable, will need drawn morning of procedure.  Pt and Dr. Harrington Challenger aware of all of the above.   Prescreening for lab appointment completed:     COVID-19 Pre-Screening Questions:  . In the past 7 to 10 days have you had a cough,  shortness of breath, headache, congestion, fever (100 or greater) body aches, chills, sore throat, or sudden loss of taste or sense of smell?  NO . Have you been around anyone with known Covid 19.  NO . Have you been around anyone who is awaiting Covid 19 test results in the past 7 to 10 days?  NO . Have you been around anyone who has been exposed to Covid 19, or has mentioned symptoms of Covid 19 within the past 7 to 10 days?  NO  If you have any concerns/questions about symptoms patients report during screening (either on the phone or at threshold). Contact the provider seeing the patient or DOD for further guidance.  If neither are available contact a member of the leadership team.

## 2018-10-30 NOTE — Telephone Encounter (Signed)
Michalene RN for cardiologist Dr Harrington Challenger. She is seeking information for rapid COVID-19 testing for patient. Michalene was informed that our drive up site testing is not the rapid test.  She will contact [facility] for pre procedure testing for COVID.

## 2018-10-31 ENCOUNTER — Other Ambulatory Visit (HOSPITAL_COMMUNITY)
Admission: RE | Admit: 2018-10-31 | Discharge: 2018-10-31 | Disposition: A | Discharge status: Home / Self Care | Payer: 59 | Source: Ambulatory Visit | Attending: Cardiology | Admitting: Cardiology

## 2018-10-31 DIAGNOSIS — Z1159 Encounter for screening for other viral diseases: Secondary | ICD-10-CM | POA: Insufficient documentation

## 2018-10-31 LAB — CBC
Hematocrit: 39.4 % (ref 34.0–46.6)
Hemoglobin: 12.7 g/dL (ref 11.1–15.9)
MCH: 25.8 pg — ABNORMAL LOW (ref 26.6–33.0)
MCHC: 32.2 g/dL (ref 31.5–35.7)
MCV: 80 fL (ref 79–97)
Platelets: 245 10*3/uL (ref 150–450)
RBC: 4.93 x10E6/uL (ref 3.77–5.28)
RDW: 13.6 % (ref 11.7–15.4)
WBC: 4.4 10*3/uL (ref 3.4–10.8)

## 2018-10-31 LAB — BASIC METABOLIC PANEL
BUN/Creatinine Ratio: 12 (ref 9–23)
BUN: 9 mg/dL (ref 6–24)
CO2: 26 mmol/L (ref 20–29)
Calcium: 9.6 mg/dL (ref 8.7–10.2)
Chloride: 102 mmol/L (ref 96–106)
Creatinine, Ser: 0.73 mg/dL (ref 0.57–1.00)
GFR calc Af Amer: 113 mL/min/{1.73_m2} (ref >59)
GFR calc non Af Amer: 98 mL/min/{1.73_m2} (ref >59)
Glucose: 89 mg/dL (ref 65–99)
Potassium: 4.5 mmol/L (ref 3.5–5.2)
Sodium: 141 mmol/L (ref 134–144)

## 2018-10-31 LAB — SARS CORONAVIRUS 2 (TAT 6-24 HRS): SARS Coronavirus 2: NEGATIVE

## 2018-11-01 ENCOUNTER — Encounter (HOSPITAL_COMMUNITY): Payer: Self-pay

## 2018-11-01 ENCOUNTER — Ambulatory Visit (HOSPITAL_COMMUNITY)
Admission: RE | Admit: 2018-11-01 | Discharge: 2018-11-01 | Disposition: A | Discharge status: Home / Self Care | Payer: 59 | Attending: Cardiology | Admitting: Cardiology

## 2018-11-01 ENCOUNTER — Encounter (HOSPITAL_COMMUNITY)
Admission: RE | Disposition: A | Discharge status: Home / Self Care | Payer: Self-pay | Source: Home / Self Care | Attending: Cardiology

## 2018-11-01 ENCOUNTER — Ambulatory Visit (HOSPITAL_BASED_OUTPATIENT_CLINIC_OR_DEPARTMENT_OTHER)
Admission: RE | Admit: 2018-11-01 | Discharge: 2018-11-01 | Disposition: A | Discharge status: Home / Self Care | Payer: 59 | Source: Home / Self Care | Attending: Internal Medicine | Admitting: Internal Medicine

## 2018-11-01 ENCOUNTER — Other Ambulatory Visit: Payer: Self-pay

## 2018-11-01 DIAGNOSIS — Z7982 Long term (current) use of aspirin: Secondary | ICD-10-CM | POA: Diagnosis not present

## 2018-11-01 DIAGNOSIS — Z8249 Family history of ischemic heart disease and other diseases of the circulatory system: Secondary | ICD-10-CM | POA: Diagnosis not present

## 2018-11-01 DIAGNOSIS — Z79899 Other long term (current) drug therapy: Secondary | ICD-10-CM | POA: Diagnosis not present

## 2018-11-01 DIAGNOSIS — Q211 Atrial septal defect: Secondary | ICD-10-CM | POA: Insufficient documentation

## 2018-11-01 DIAGNOSIS — R002 Palpitations: Secondary | ICD-10-CM | POA: Insufficient documentation

## 2018-11-01 DIAGNOSIS — I340 Nonrheumatic mitral (valve) insufficiency: Secondary | ICD-10-CM

## 2018-11-01 DIAGNOSIS — Z9851 Tubal ligation status: Secondary | ICD-10-CM | POA: Insufficient documentation

## 2018-11-01 DIAGNOSIS — K2270 Barrett's esophagus without dysplasia: Secondary | ICD-10-CM | POA: Diagnosis not present

## 2018-11-01 HISTORY — PX: BUBBLE STUDY: SHX6837

## 2018-11-01 HISTORY — PX: TEE WITHOUT CARDIOVERSION: SHX5443

## 2018-11-01 SURGERY — ECHOCARDIOGRAM, TRANSESOPHAGEAL
Anesthesia: Moderate Sedation

## 2018-11-01 MED ORDER — MIDAZOLAM HCL (PF) 5 MG/ML IJ SOLN
INTRAMUSCULAR | Status: AC
  Filled 2018-11-01: qty 2

## 2018-11-01 MED ORDER — SODIUM CHLORIDE (PF) 0.9 % IJ SOLN
INTRAMUSCULAR | Status: DC | PRN
  Administered 2018-11-01: 20 mL via INTRAVENOUS

## 2018-11-01 MED ORDER — DIPHENHYDRAMINE HCL 50 MG/ML IJ SOLN
INTRAMUSCULAR | Status: AC
  Filled 2018-11-01: qty 1

## 2018-11-01 MED ORDER — MIDAZOLAM HCL (PF) 10 MG/2ML IJ SOLN
INTRAMUSCULAR | Status: DC | PRN
  Administered 2018-11-01: 2 mg via INTRAVENOUS
  Administered 2018-11-01 (×2): 1 mg via INTRAVENOUS
  Administered 2018-11-01: 2 mg via INTRAVENOUS

## 2018-11-01 MED ORDER — FENTANYL CITRATE (PF) 100 MCG/2ML IJ SOLN
INTRAMUSCULAR | Status: DC | PRN
  Administered 2018-11-01 (×3): 25 ug via INTRAVENOUS

## 2018-11-01 MED ORDER — LIDOCAINE VISCOUS HCL 2 % MT SOLN
OROMUCOSAL | Status: DC | PRN
  Administered 2018-11-01: 10 mL via OROMUCOSAL

## 2018-11-01 MED ORDER — LIDOCAINE VISCOUS HCL 2 % MT SOLN
OROMUCOSAL | Status: AC
  Filled 2018-11-01: qty 30

## 2018-11-01 MED ORDER — FENTANYL CITRATE (PF) 100 MCG/2ML IJ SOLN
INTRAMUSCULAR | Status: AC
  Filled 2018-11-01: qty 2

## 2018-11-01 MED ORDER — SODIUM CHLORIDE 0.9 % IV SOLN
INTRAVENOUS | Status: DC
  Administered 2018-11-01: 08:00:00 via INTRAVENOUS

## 2018-11-01 NOTE — Discharge Instructions (Signed)
Transesophageal Echocardiogram Transesophageal echocardiogram (TEE) is a test that uses sound waves to take pictures of your heart. TEE is done by passing a flexible tube down the esophagus. The esophagus is the tube that carries food from the throat to the stomach. The pictures give detailed images of your heart. This can help your doctor see if there are problems with your heart.  What happens during the procedure?  To lower your risk of infection, your doctors will wash or clean their hands.  An IV will be put into one of your veins.  You will be given a medicine to help you relax (sedative).  A medicine may be sprayed or gargled. This numbs the back of your throat.  Your blood pressure, heart rate, and breathing will be watched.  You may be asked to lay on your left side.  A bite block will be placed in your mouth. This keeps you from biting the tube.  The tip of the TEE probe will be placed into the back of your mouth.  You will be asked to swallow.  Your doctor will take pictures of your heart.  The probe and bite block will be taken out. The procedure may vary among doctors and hospitals.   What happens after the procedure?   Your blood pressure, heart rate, breathing rate, and blood oxygen level will be watched until the medicines you were given have worn off.  When you first wake up, your throat may feel sore and numb. This will get better over time. You will not be allowed to eat or drink until the numbness has gone away.  Do not drive for 24 hours if you were given a medicine to help you relax. Summary  TEE is a test that uses sound waves to take pictures of your heart.  You will be given a medicine to help you relax.  Do not drive for 24 hours if you were given a medicine to help you relax. This information is not intended to replace advice given to you by your health care provider. Make sure you discuss any questions you have with your health care  provider. Document Released: 02/14/2009 Document Revised: 01/06/2018 Document Reviewed: 07/21/2016 Elsevier Patient Education  2020 [employer].

## 2018-11-01 NOTE — H&P (Signed)
Cardiology Admission History and Physical:   Patient ID: [name] MRN: [id number]; DOB: [birth year]   Admission date: 11/01/2018  Primary Care Provider: Burnard Bunting, MD Primary Cardiologist: [name], MD   Chief Complaint:  palpitations  History of Present Illness:   [name] is a 48 y.o. female with h/o PFO is presenting for TEE evaluation.   Heart Pathway Score:     Past Medical History:  Diagnosis Date  . Barrett's syndrome   . Gastric polyp   . Hiatal hernia   . HNP (herniated nucleus pulposus with myelopathy), thoracic   . Lumbar disc disease   . Lumbar spondylosis   . PFO (patent foramen ovale)     Past Surgical History:  Procedure Laterality Date  . CESAREAN SECTION  2007, 2009   x 2  . HEMILAMINOTOMY LUMBAR SPINE  09/2003  . KNEE ARTHROSCOPY Right   . MICRODISCECTOMY LUMBAR     L5-S1  . TUBAL LIGATION       Medications Prior to Admission: Prior to Admission medications   Medication Sig Start Date End Date Taking? Authorizing Provider  aspirin EC 81 MG tablet Take 81 mg by mouth daily.   Yes [provider]  Biotin 10 MG CAPS Take 10 mg by mouth daily.    Yes [provider]  Calcium Carb-Cholecalciferol (CALCIUM 600/VITAMIN D3 PO) Take 1 tablet by mouth daily.   Yes [provider]  cetirizine (ZYRTEC) 10 MG tablet Take 10 mg by mouth daily.   Yes [provider]  Cholecalciferol (VITAMIN D3) 25 MCG (1000 UT) CAPS Take 1,000 Units by mouth daily.   Yes [provider]  Multiple Vitamin (MULTIVITAMIN WITH MINERALS) TABS Take 1 tablet by mouth daily.   Yes [provider]  Omega-3 Fatty Acids (FISH OIL PO) Take 4 capsules by mouth daily.   Yes [provider]  omeprazole (PRILOSEC) 20 MG capsule Take 20 mg by mouth daily.   Yes [provider]  Probiotic Product (PROBIOTIC PO) Take 1 capsule by mouth daily.   Yes [provider]     Allergies:    Allergies   Allergen Reactions  . Pollen Extract   . Quercus Robur     Social History:   Social History   Socioeconomic History  . Marital status: Widowed    Spouse name: Not on file  . Number of children: 2  . Years of education: Not on file  . Highest education level: Not on file  Occupational History  . Occupation: Event organiser: [employer]  . Financial resource strain: Not on file  . Food insecurity    Worry: Not on file    Inability: Not on file  . Transportation needs    Medical: Not on file    Non-medical: Not on file  Tobacco Use  . Smoking status: Never Smoker  . Smokeless tobacco: Never Used  Substance and Sexual Activity  . Alcohol use: Yes    Comment: 1-2 per month  . Drug use: No  . Sexual activity: Not Currently    Partners: Male    Birth control/protection: None  Lifestyle  . Physical activity    Days per week: Not on file    Minutes per session: Not on file  . Stress: Not on file  Relationships  . Social Herbalist on phone: Not on file    Gets together: Not on file    Attends  religious service: Not on file    Active member of club or organization: Not on file    Attends meetings of clubs or organizations: Not on file    Relationship status: Not on file  . Intimate partner violence    Fear of current or ex partner: Not on file    Emotionally abused: Not on file    Physically abused: Not on file    Forced sexual activity: Not on file  Other Topics Concern  . Not on file  Social History Narrative  . Not on file    Family History:   The patient's family history includes Heart disease in her father; Hypertension in her father; Leukemia in her mother. There is no history of Colon cancer.    ROS:  Please see the history of present illness.  All other ROS reviewed and negative.     Physical Exam/Data:   Vitals:   11/01/18 0746  BP: (!) 103/59  Pulse: (!) 55  Resp: 14  Temp: (!) 97.5 F (36.4 C)  TempSrc:  Temporal  SpO2: 100%  Weight: 58.1 kg  Height: 5\' 3"  (1.6 m)   No intake or output data in the 24 hours ending 11/01/18 0839 Last 3 Weights 11/01/2018 07/17/2018 07/07/2018  Weight (lbs) 128 lb 130 lb 2 oz 131 lb 12.8 oz  Weight (kg) 58.06 kg 59.024 kg 59.784 kg     Body mass index is 22.67 kg/m.  General:  Well nourished, well developed, in no acute distress HEENT: normal Lymph: no adenopathy Neck: no JVD Endocrine:  No thryomegaly Vascular: No carotid bruits; FA pulses 2+ bilaterally without bruits  Cardiac:  normal S1, S2; RRR; no murmur  Lungs:  clear to auscultation bilaterally, no wheezing, rhonchi or rales  Abd: soft, nontender, no hepatomegaly  Ext: no edema Musculoskeletal:  No deformities, BUE and BLE strength normal and equal Skin: warm and dry  Neuro:  CNs 2-12 intact, no focal abnormalities noted Psych:  Normal affect   EKG:  The ECG that was not done.  Relevant CV Studies:   Laboratory Data:  High Sensitivity Troponin:  No results for input(s): TROPONINIHS in the last 720 hours.    Cardiac EnzymesNo results for input(s): TROPONINI in the last 168 hours. No results for input(s): TROPIPOC in the last 168 hours.  Chemistry Recent Labs  Lab 10/30/18 1236  NA 141  K 4.5  CL 102  CO2 26  GLUCOSE 89  BUN 9  CREATININE 0.73  CALCIUM 9.6  GFRNONAA 98  GFRAA 113    No results for input(s): PROT, ALBUMIN, AST, ALT, ALKPHOS, BILITOT in the last 168 hours. Hematology Recent Labs  Lab 10/30/18 1236  WBC 4.4  RBC 4.93  HGB 12.7  HCT 39.4  MCV 80  MCH 25.8*  MCHC 32.2  RDW 13.6  PLT 245   BNPNo results for input(s): BNP, PROBNP in the last 168 hours.  DDimer No results for input(s): DDIMER in the last 168 hours.   Radiology/Studies:  No results found.  Assessment and Plan:   [name] is a 48 y.o. female with h/o PFO is presenting for TEE evaluation.  Covid 19 test is negative.   For questions or updates, please contact [place] Please  consult [email or web address] for contact info under        Signed, Ena Dawley, MD  11/01/2018 8:39 AM

## 2018-11-01 NOTE — Interval H&P Note (Signed)
History and Physical Interval Note:  11/01/2018 8:41 AM  [name]  has presented today for surgery, with the diagnosis of INTERATRIAL SEPTUM DEFECT.  The various methods of treatment have been discussed with the patient and family. After consideration of risks, benefits and other options for treatment, the patient has consented to  Procedure(s): TRANSESOPHAGEAL ECHOCARDIOGRAM (TEE) (N/A) as a surgical intervention.  The patient's history has been reviewed, patient examined, no change in status, stable for surgery.  I have reviewed the patient's chart and labs.  Questions were answered to the patient's satisfaction.     [name]

## 2018-11-01 NOTE — CV Procedure (Signed)
     Transesophageal Echocardiogram Note  KECHIA YAHNKE [id number] 12-Nov-1970  Procedure: Transesophageal Echocardiogram Indications: PFO  Procedure Details Consent: Obtained Time Out: Verified patient identification, verified procedure, site/side was marked, verified correct patient position, special equipment/implants available, Radiology Safety Procedures followed,  medications/allergies/relevent history reviewed, required imaging and test results available.  Performed  Medications: During this procedure the patient is administered a total of Versed 6 mg and Fentanyl 50 mcg to achieve and maintain moderate conscious sedation.  The patient's heart rate, blood pressure, and oxygen saturation are monitored continuously during the procedure. The period of conscious sedation is 30 minutes, of which I was present face-to-face 100% of this time.   Large PFO, positive bubble study, no ASD, hypermobile interatrial septum. Full report to follow.   Complications: No apparent complications Patient did tolerate procedure well.  Ena Dawley, MD, [facility] 11/01/2018, 11:35 AM

## 2018-11-05 IMAGING — MG DIGITAL SCREENING BILATERAL MAMMOGRAM WITH TOMO AND CAD
8 series · 8 of 24 positions shown · non-contrast
Comparison: Previous exam(s).

CLINICAL DATA: Screening.

EXAM:
DIGITAL SCREENING BILATERAL MAMMOGRAM WITH TOMO AND CAD

[R MLO synth-2D]
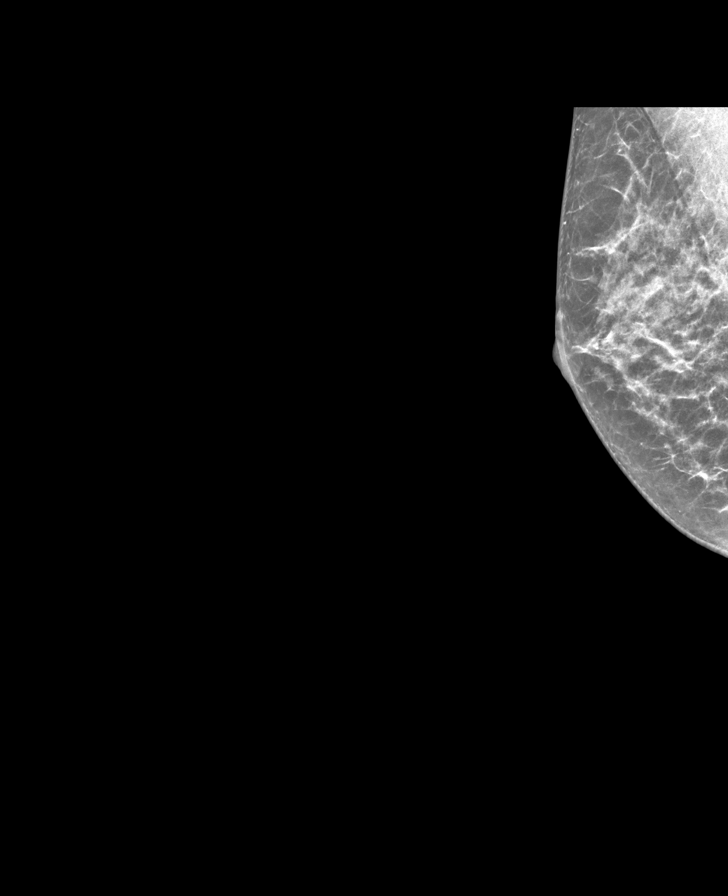

[R CC synth-2D]
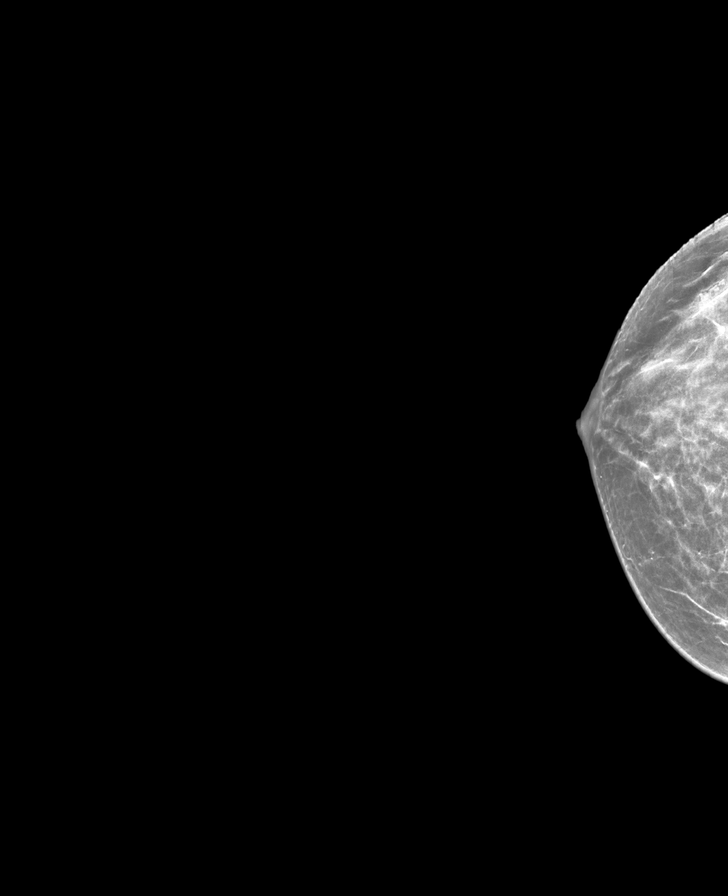

[L MLO synth-2D]
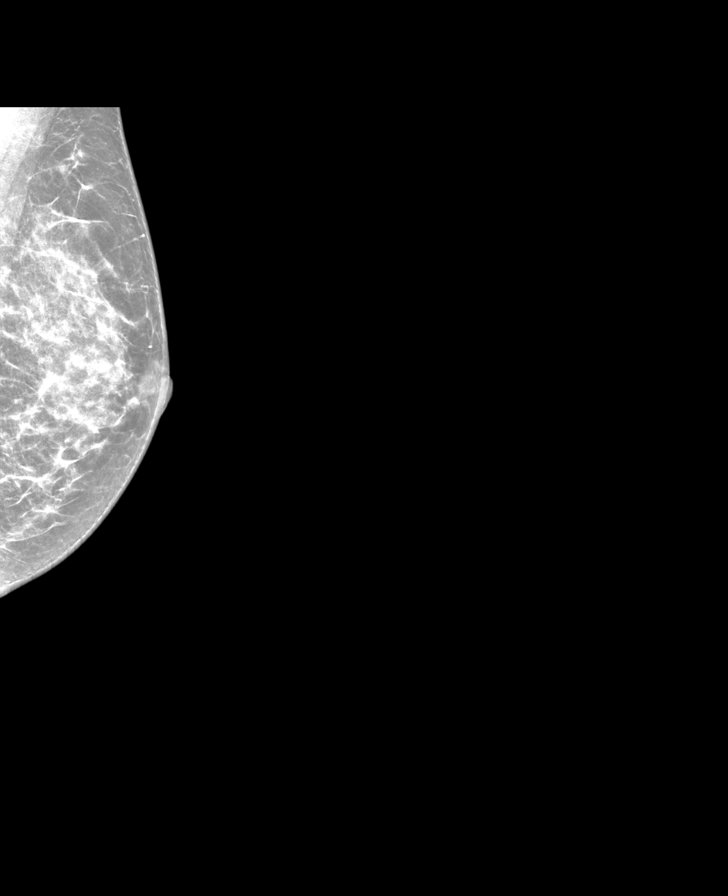

[L CC synth-2D]
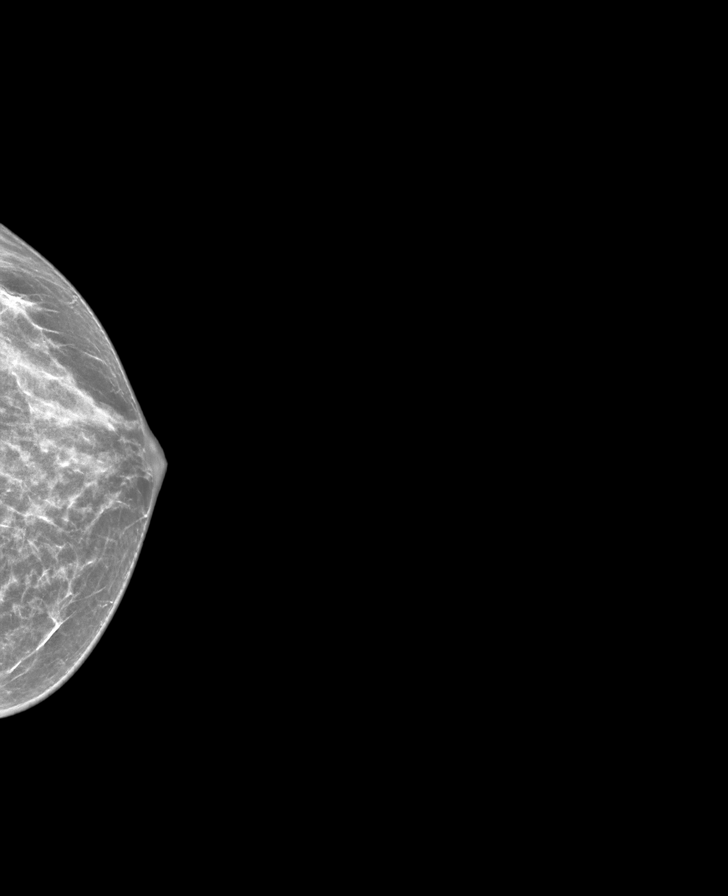

[L CC tomo · tomo slice 26/51.0]
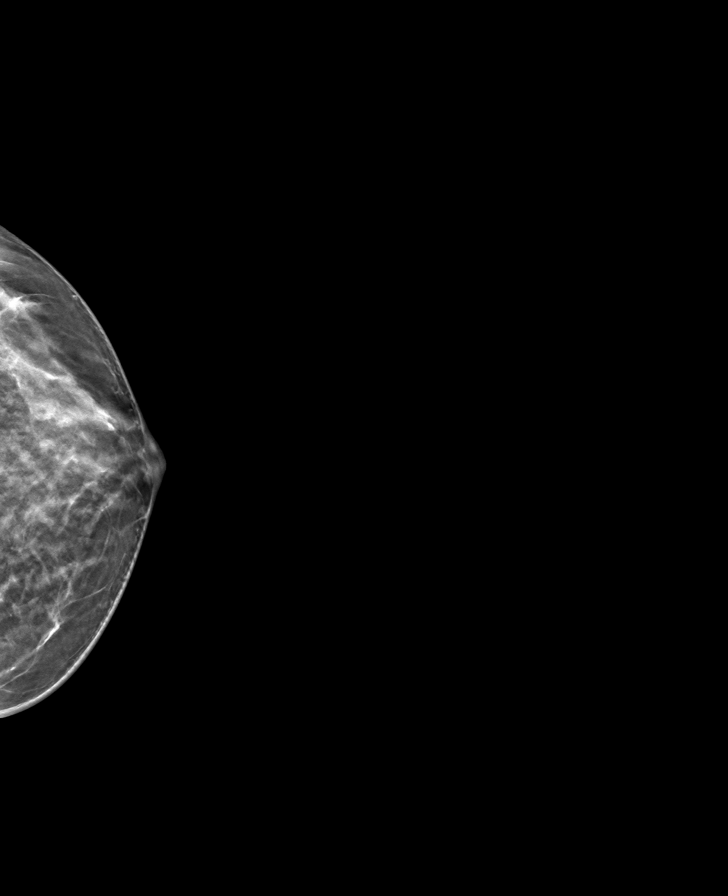

[R MLO tomo · tomo slice 27/52.0]
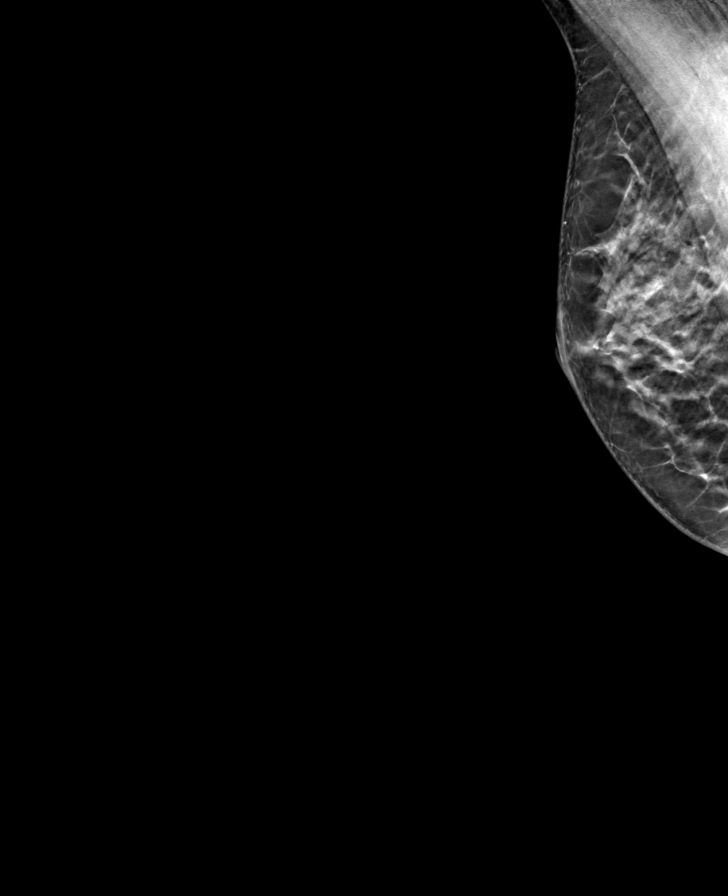

[R CC tomo · tomo slice 27/52.0]
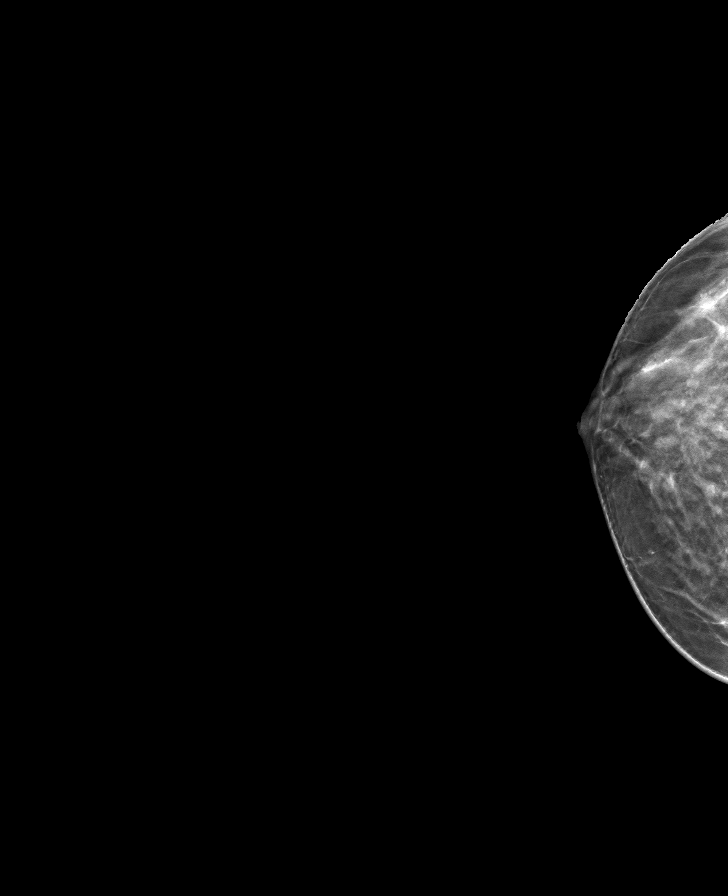

[L MLO tomo · tomo slice 27/53.0]
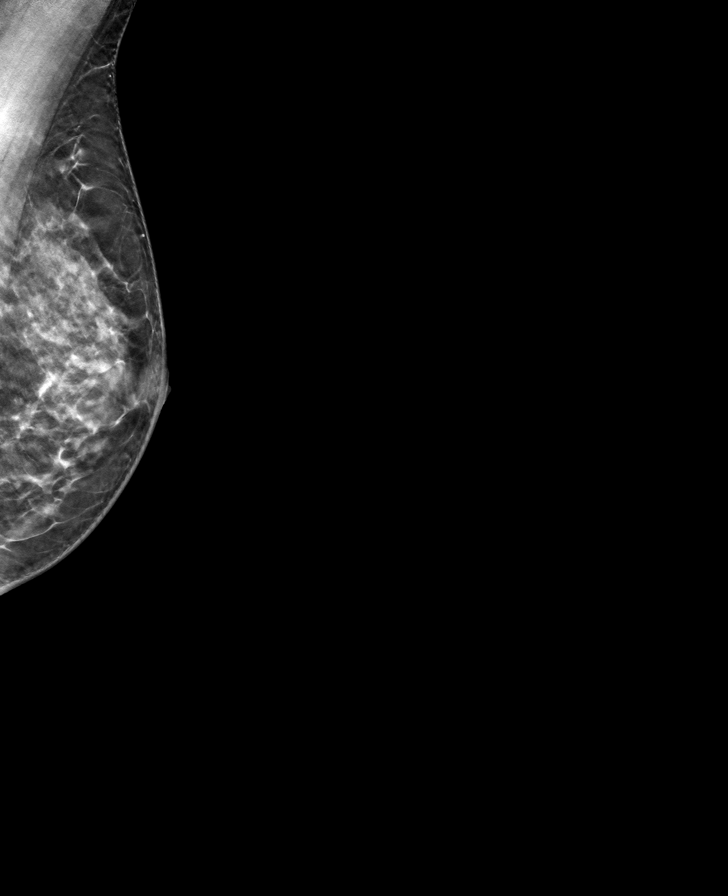

[8 of 24 positions shown; findings below may reference images not displayed]

ACR Breast Density Category c: The breast tissue is heterogeneously
dense, which may obscure small masses.
FINDINGS: In the right breast, a possible asymmetry warrants further
evaluation. In the left breast, no findings suspicious for
malignancy. Images were processed with CAD.
IMPRESSION: Further evaluation is suggested for possible asymmetry in the right
breast.

RECOMMENDATION:
Diagnostic mammogram and possibly ultrasound of the right breast.
(Code:EU-[id number])

The patient will be contacted regarding the findings, and additional
imaging will be scheduled.

BI-RADS CATEGORY  0: Incomplete. Need additional imaging evaluation
and/or prior mammograms for comparison.

## 2018-11-23 ENCOUNTER — Telehealth: Payer: Self-pay | Admitting: Cardiovascular Disease

## 2018-11-23 NOTE — Telephone Encounter (Addendum)
Dr. Prudy [name] reports she cannot have PFO closure 8/19 as tentatively scheduled.  Procedure cancelled. She understands the procedure will be rescheduled if she and Dr. Burt Knack decide to proceed at consult.

## 2018-11-23 NOTE — Telephone Encounter (Signed)
New Message  Patient returning call to Dr. Antionette Char Nurse about the procedure information. Please give patient a call back.

## 2018-12-02 ENCOUNTER — Encounter

## 2018-12-06 ENCOUNTER — Encounter: Payer: Self-pay | Admitting: Gastroenterology

## 2018-12-06 ENCOUNTER — Ambulatory Visit: Payer: 59 | Admitting: Gastroenterology

## 2018-12-06 VITALS — BP 104/72 | HR 68 | Temp 98.2°F | Ht 63.25 in | Wt 132.4 lb

## 2018-12-06 DIAGNOSIS — K2270 Barrett's esophagus without dysplasia: Secondary | ICD-10-CM | POA: Diagnosis not present

## 2018-12-06 DIAGNOSIS — K602 Anal fissure, unspecified: Secondary | ICD-10-CM | POA: Diagnosis not present

## 2018-12-06 NOTE — Patient Instructions (Signed)
Start taking famotidine 20 mg daily. If not helping then increase to 40 mg daily. If still not helping increase to famotidine 40 mg twice daily. If still no improvement then start back on omeprazole.   You will be due for a recall Upper Endoscopy in 03/2019. We will send you a reminder in the mail when it gets closer to that time.  Thank you for choosing me and [facility].  Pricilla Riffle. Dagoberto Ligas., MD., [facility]

## 2018-12-06 NOTE — Progress Notes (Signed)
    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  In person office follow-up was delayed during COVID-19 pandemic.  She states she used diltiazem gel as prescribed 3 times daily for 8 weeks and then began using it on a daily basis.  She states her symptoms abated completely after 2 weeks. She has not had any further rectal bleeding or rectal pain.  Her reflux symptoms are under very good control on omeprazole and she desires to change to an H2 blocker, if possible.  Current Medications, Allergies, Past Medical History, Past Surgical History, Family History and Social History were reviewed in [employer] record.   Physical Exam: General: Well developed, well nourished, no acute distress Head: Normocephalic and atraumatic Eyes:  sclerae anicteric, EOMI Ears: Normal auditory acuity Mouth: No deformity or lesions Lungs: Clear throughout to auscultation Heart: Regular rate and rhythm; no murmurs, rubs or bruits Abdomen: Soft, non tender and non distended. No masses, hepatosplenomegaly or hernias noted. Normal Bowel sounds Rectal: Not done Musculoskeletal: Symmetrical with no gross deformities  Pulses:  Normal pulses noted Extremities: No clubbing, cyanosis, edema or deformities noted Neurological: Alert oriented x 4, grossly nonfocal Psychological:  Alert and cooperative. Normal mood and affect   Assessment and Recommendations:  1.  Anal fissure, resolved.  Discontinue diltiazem gel.  Advised to contact my office if symptoms return.  Small internal hemorrhoids noted on prior colonoscopy, currently asymptomatic.  2.  Short segment Barrett's esophagus without dysplasia.  Reflux symptoms well controlled.  It is reasonable to try to change to an H2 blocker.  Begin famotidine 20 mg daily, if symptoms not well controlled increase to 40 mg daily, if symptoms not adequately controlled  increase to 40 mg twice daily and finally if not adequately controlled then resume omeprazole 20 mg daily. Surveillance EGD in 03/2019.   3. CRC screening, average risk.  A 10-year interval screening colonoscopy is recommended in September 2027.

## 2018-12-11 ENCOUNTER — Other Ambulatory Visit: Payer: Self-pay

## 2018-12-11 ENCOUNTER — Ambulatory Visit: Payer: 59 | Admitting: Cardiovascular Disease

## 2018-12-11 ENCOUNTER — Encounter: Payer: Self-pay | Admitting: Cardiovascular Disease

## 2018-12-11 VITALS — BP 126/78 | HR 58 | Ht 63.6 in | Wt 135.1 lb

## 2018-12-11 DIAGNOSIS — Q211 Atrial septal defect: Secondary | ICD-10-CM

## 2018-12-11 DIAGNOSIS — Q2112 Patent foramen ovale: Secondary | ICD-10-CM

## 2018-12-11 NOTE — Progress Notes (Signed)
Cardiology Office Note:    Date:  12/11/2018   ID:  [name], DOB May 27, 1970, MRN [id number]  PCP:  Burnard Bunting, MD  Cardiologist:  [name], MD  Electrophysiologist:  None   Referring MD: Burnard Bunting, MD   Chief Complaint  Patient presents with  . PFO evaluation   History of Present Illness:    [name] is a 48 y.o. female with a hx of PFO, presenting for initial evaluation for PFO closure.   She has a longstanding history of heart murmur. She has a remote history of syncope occurring several hours after donating blood. She's had echo studies done over time that have been essentially normal. She then developed heart palpitations during a period of stress, and a surface echo showed an aneurysmal atrial septum and raised the possibility of a PFO. A limited echo with bubble study was done and confirmed an atrial level R--->L shunt.  More recently a confirmatory transesophageal echo was performed and this demonstrated no evidence of valvular heart disease.  The patient's LV function is normal.  RV size and function are normal.  She is demonstrated to have a moderate to large PFO with positive agitated saline study.  The atrial septum is redundant but not technically aneurysmal.  The patient is here alone today.  She is physically active with no exertional symptoms.  She specifically denies chest pain, chest pressure, shortness of breath, edema, lightheadedness, or syncope.  She has no history of stroke or TIA.  She has no history of migraine headaches.  She has no history of hypercoagulable disorder.  Past Medical History:  Diagnosis Date  . Barrett's syndrome   . Gastric polyp   . Hiatal hernia   . HNP (herniated nucleus pulposus with myelopathy), thoracic   . Lumbar disc disease   . Lumbar spondylosis   . PFO (patent foramen ovale)     Past Surgical History:  Procedure Laterality Date  . BUBBLE STUDY  11/01/2018   Procedure: BUBBLE STUDY;  Surgeon:  Dorothy Spark, MD;  Location: [facility];  Service: Cardiovascular;;  . CESAREAN SECTION  2007, 2009   x 2  . HEMILAMINOTOMY LUMBAR SPINE  09/2003  . KNEE ARTHROSCOPY Right   . MICRODISCECTOMY LUMBAR     L5-S1  . TEE WITHOUT CARDIOVERSION N/A 11/01/2018   Procedure: TRANSESOPHAGEAL ECHOCARDIOGRAM (TEE);  Surgeon: Dorothy Spark, MD;  Location[facility] ENDOSCOPY;  Service: Cardiovascular;  Laterality: N/A;  . TUBAL LIGATION      Current Medications: Current Meds  Medication Sig  . aspirin EC 81 MG tablet Take 81 mg by mouth daily.  . Biotin 10 MG CAPS Take 10 mg by mouth daily.   . Calcium Carb-Cholecalciferol (CALCIUM 600/VITAMIN D3 PO) Take 1 tablet by mouth daily.  . cetirizine (ZYRTEC) 10 MG tablet Take 10 mg by mouth daily.  . Cholecalciferol (VITAMIN D3) 25 MCG (1000 UT) CAPS Take 1,000 Units by mouth daily.  . Multiple Vitamin (MULTIVITAMIN WITH MINERALS) TABS Take 1 tablet by mouth daily.  . Omega-3 Fatty Acids (FISH OIL PO) Take 4 capsules by mouth daily.  Marland Kitchen omeprazole (PRILOSEC) 20 MG capsule Take 20 mg by mouth daily.  . Probiotic Product (PROBIOTIC PO) Take 1 capsule by mouth daily.     Allergies:   Pollen extract and Quercus robur   Social History   Socioeconomic History  . Marital status: Widowed    Spouse name: Not on file  . Number of children: 2  .  Years of education: Not on file  . Highest education level: Not on file  Occupational History  . Occupation: Event organiser: [employer]  . Financial resource strain: Not on file  . Food insecurity    Worry: Not on file    Inability: Not on file  . Transportation needs    Medical: Not on file    Non-medical: Not on file  Tobacco Use  . Smoking status: Never Smoker  . Smokeless tobacco: Never Used  Substance and Sexual Activity  . Alcohol use: Yes    Comment: 1-2 per month  . Drug use: No  . Sexual activity: Not Currently    Partners: Male    Birth control/protection: None   Lifestyle  . Physical activity    Days per week: Not on file    Minutes per session: Not on file  . Stress: Not on file  Relationships  . Social Herbalist on phone: Not on file    Gets together: Not on file    Attends religious service: Not on file    Active member of club or organization: Not on file    Attends meetings of clubs or organizations: Not on file    Relationship status: Not on file  Other Topics Concern  . Not on file  Social History Narrative  . Not on file     Family History: The patient's family history includes Heart disease in her father; Hypertension in her father; Leukemia in her mother. There is no history of Colon cancer.  ROS:   Please see the history of present illness.    All other systems reviewed and are negative.  EKGs/Labs/Other Studies Reviewed:    The following studies were reviewed today: Cardiac Event Monitor: Study Highlights  Sinus rhythm, rates 40 to 175 bpm    No arrhythmias detected     Limited Echo with bubble 06/08/2017: Study Conclusions  - Left ventricle: The cavity size was normal. Systolic function was   normal. The estimated ejection fraction was in the range of 60%   to 65%. - Right ventricle: The cavity size was normal. Systolic function   was normal. - Atrial septum: Strongly positive bubble study, suspect PFO. - Inferior vena cava: The vessel was normal in size. The   respirophasic diameter changes were in the normal range (>= 50%),   consistent with normal central venous pressure.  Impressions:  - Limited echo for PFO.  TEE: IMPRESSIONS    1. The left ventricle has normal systolic function, with an ejection fraction of 60-65%. The cavity size was normal.  2. The right ventricle has normal systolc function. The cavity was normal. There is no increase in right ventricular wall thickness.  3. Aortic valve regurgitation was not assessed by color flow Doppler.  4. Pulmonic valve regurgitation was  not assessed by color flow Doppler.  5. Evidence of atrial level shunting detected by color flow Doppler.  6. There is redundancy of the interatrial septum.  SUMMARY   There is hypermobile interatrial septum, no aneurysm. There is no obvious left to right flow but strongly positive bubble study consistent with PFO. No ASD is seen. Eustachian valve is present.  FINDINGS  Left Ventricle: The left ventricle has normal systolic function, with an ejection fraction of 60-65%. The cavity size was normal. There is no increase in left ventricular wall thickness.  Right Ventricle: The right ventricle has normal systolic function. The cavity was  normal. There is no increase in right ventricular wall thickness.  Left Atrium: Left atrial size was normal in size.   Right Atrium: Right atrial size was normal in size. Right atrial pressure is estimated at 10 mmHg.  Interatrial Septum: Evidence of atrial level shunting detected by color flow Doppler. Agitated saline contrast was given intravenously to evaluate for intracardiac shunting. A There is redundancy of the interatrial septum is seen.  Pericardium: There is no evidence of pericardial effusion.  Mitral Valve: Mitral valve regurgitation is mild by color flow Doppler.  Tricuspid Valve: Tricuspid valve regurgitation is trivial by color flow Doppler.  Aortic Valve: Aortic valve regurgitation was not assessed by color flow Doppler.  Pulmonic Valve: Pulmonic valve regurgitation was not assessed by color flow Doppler.  Venous: The inferior vena cava is normal in size with greater than 50% respiratory variability.  Recent Labs: 10/30/2018: BUN 9; Creatinine, Ser 0.73; Hemoglobin 12.7; Platelets 245; Potassium 4.5; Sodium 141  Recent Lipid Panel No results found for: CHOL, TRIG, HDL, CHOLHDL, VLDL, LDLCALC, LDLDIRECT  Physical Exam:    VS:  BP 126/78   Pulse (!) 58   Ht 5' 3.6" (1.615 m)   Wt 135 lb 1.9 oz (61.3 kg)   SpO2 99%    BMI 23.49 kg/m     Wt Readings from Last 3 Encounters:  12/11/18 135 lb 1.9 oz (61.3 kg)  12/06/18 132 lb 6 oz (60 kg)  11/01/18 128 lb (58.1 kg)     GEN:  Well nourished, well developed in no acute distress HEENT: Normal NECK: No JVD; No carotid bruits LYMPHATICS: No lymphadenopathy CARDIAC: RRR, no murmurs, rubs, gallops RESPIRATORY:  Clear to auscultation without rales, wheezing or rhonchi  ABDOMEN: Soft, non-tender, non-distended MUSCULOSKELETAL:  No edema; No deformity  SKIN: Warm and dry NEUROLOGIC:  Alert and oriented x 3 PSYCHIATRIC:  Normal affect   ASSESSMENT:    1. PFO (patent foramen ovale)    PLAN:    In order of problems listed above:  1. The patient's TEE images are personally reviewed with findings outlined above.  While she does have a large PFO, she has no clinical history of stroke or TIA.  She has no suggestive symptoms of orthodeoxia/platypnea syndrome.  We had extensive discussion today about the finding of PFO and potential risk of systemic embolism, stroke, and TIA.  She understands that all of the clinical trial data regarding transcatheter PFO closure focuses on secondary risk reduction of stroke and TIA in patients who have had an initial event.  She understands that the presence of PFO in the general population is exceedingly common at approximately 25%.  I have recommended that she remain on low-dose aspirin for antiplatelet therapy.  I have also recommended that she avoid any estrogen replacement therapy or other medications that could cause a hypercoagulable state.  She is reassured today and understands that if she develops clinical symptoms, including stroke or TIA, that her PFO appears to be amenable to transcatheter closure based on anatomic characteristics.  She will follow-up as needed.   Medication Adjustments/Labs and Tests Ordered: Current medicines are reviewed at length with the patient today.  Concerns regarding medicines are outlined  above.  No orders of the defined types were placed in this encounter.  No orders of the defined types were placed in this encounter.   Patient Instructions  Please call us if you need anything!    Signed, Sherren Mocha, MD  12/11/2018 12:52 PM    [facility]

## 2018-12-11 NOTE — Patient Instructions (Signed)
Please call us if you need anything.

## 2018-12-20 ENCOUNTER — Ambulatory Visit (HOSPITAL_COMMUNITY): Admit: 2018-12-20 | Payer: 59 | Admitting: Cardiovascular Disease

## 2018-12-20 ENCOUNTER — Encounter (HOSPITAL_COMMUNITY): Payer: Self-pay

## 2018-12-20 SURGERY — PATENT FORAMEN OVALE (PFO) CLOSURE
Anesthesia: LOCAL

## 2019-03-15 ENCOUNTER — Encounter: Payer: Self-pay | Admitting: Gastroenterology

## 2019-03-22 ENCOUNTER — Other Ambulatory Visit: Payer: Self-pay

## 2019-03-22 DIAGNOSIS — Z20822 Contact with and (suspected) exposure to covid-19: Secondary | ICD-10-CM

## 2019-03-24 LAB — NOVEL CORONAVIRUS, NAA: SARS-CoV-2, NAA: NOT DETECTED

## 2019-04-10 ENCOUNTER — Ambulatory Visit (AMBULATORY_SURGERY_CENTER): Payer: 59

## 2019-04-10 ENCOUNTER — Other Ambulatory Visit: Payer: Self-pay

## 2019-04-10 ENCOUNTER — Encounter: Payer: Self-pay | Admitting: Gastroenterology

## 2019-04-10 VITALS — Temp 97.0°F | Ht 63.6 in | Wt 133.6 lb

## 2019-04-10 DIAGNOSIS — K2270 Barrett's esophagus without dysplasia: Secondary | ICD-10-CM

## 2019-04-10 DIAGNOSIS — Z1159 Encounter for screening for other viral diseases: Secondary | ICD-10-CM

## 2019-04-10 NOTE — Progress Notes (Signed)

## 2019-04-13 ENCOUNTER — Ambulatory Visit: Payer: 59

## 2019-04-13 DIAGNOSIS — Z1159 Encounter for screening for other viral diseases: Secondary | ICD-10-CM

## 2019-04-14 LAB — SARS CORONAVIRUS 2 (TAT 6-24 HRS): SARS Coronavirus 2: NEGATIVE

## 2019-04-18 ENCOUNTER — Ambulatory Visit (AMBULATORY_SURGERY_CENTER): Payer: 59 | Admitting: Gastroenterology

## 2019-04-18 ENCOUNTER — Encounter: Payer: Self-pay | Admitting: Gastroenterology

## 2019-04-18 ENCOUNTER — Other Ambulatory Visit: Payer: Self-pay

## 2019-04-18 VITALS — BP 126/82 | HR 59 | Temp 98.0°F | Resp 19 | Ht 63.6 in | Wt 133.6 lb

## 2019-04-18 DIAGNOSIS — K219 Gastro-esophageal reflux disease without esophagitis: Secondary | ICD-10-CM

## 2019-04-18 DIAGNOSIS — K449 Diaphragmatic hernia without obstruction or gangrene: Secondary | ICD-10-CM | POA: Diagnosis not present

## 2019-04-18 DIAGNOSIS — K22719 Barrett's esophagus with dysplasia, unspecified: Secondary | ICD-10-CM | POA: Diagnosis not present

## 2019-04-18 DIAGNOSIS — K317 Polyp of stomach and duodenum: Secondary | ICD-10-CM

## 2019-04-18 MED ORDER — SODIUM CHLORIDE 0.9 % IV SOLN: 500.0000 mL | Freq: Once | INTRAVENOUS | Status: DC

## 2019-04-18 NOTE — Progress Notes (Signed)
Report given to PACU, vss 

## 2019-04-18 NOTE — Patient Instructions (Signed)
Information on Barrett's and hiatal hernias given to you today.  Await pathology results.  Continue present medications.  Repeat upper endoscopy in 3 years.  YOU HAD AN ENDOSCOPIC PROCEDURE TODAY AT [facility]:   Refer to the procedure report that was given to you for any specific questions about what was found during the examination.  If the procedure report does not answer your questions, please call your gastroenterologist to clarify.  If you requested that your care partner not be given the details of your procedure findings, then the procedure report has been included in a sealed envelope for you to review at your convenience later.  YOU SHOULD EXPECT: Some feelings of bloating in the abdomen. Passage of more gas than usual.  Walking can help get rid of the air that was put into your GI tract during the procedure and reduce the bloating. If you had a lower endoscopy (such as a colonoscopy or flexible sigmoidoscopy) you may notice spotting of blood in your stool or on the toilet paper. If you underwent a bowel prep for your procedure, you may not have a normal bowel movement for a few days.  Please Note:  You might notice some irritation and congestion in your nose or some drainage.  This is from the oxygen used during your procedure.  There is no need for concern and it should clear up in a day or so.  SYMPTOMS TO REPORT IMMEDIATELY:   Following upper endoscopy (EGD)  Vomiting of blood or coffee ground material  New chest pain or pain under the shoulder blades  Painful or persistently difficult swallowing  New shortness of breath  Fever of 100F or higher  Black, tarry-looking stools  For urgent or emergent issues, a gastroenterologist can be reached at any hour by calling [phone].   DIET:  We do recommend a small meal at first, but then you may proceed to your regular diet.  Drink plenty of fluids but you should avoid alcoholic beverages for 24  hours.  ACTIVITY:  You should plan to take it easy for the rest of today and you should NOT DRIVE or use heavy machinery until tomorrow (because of the sedation medicines used during the test).    FOLLOW UP: Our staff will call the number listed on your records 48-72 hours following your procedure to check on you and address any questions or concerns that you may have regarding the information given to you following your procedure. If we do not reach you, we will leave a message.  We will attempt to reach you two times.  During this call, we will ask if you have developed any symptoms of COVID 19. If you develop any symptoms (ie: fever, flu-like symptoms, shortness of breath, cough etc.) before then, please call [phone].  If you test positive for Covid 19 in the 2 weeks post procedure, please call and report this information to [place].    If any biopsies were taken you will be contacted by phone or by letter within the next 1-3 weeks.  Please call us at [phone] if you have not heard about the biopsies in 3 weeks.    SIGNATURES/CONFIDENTIALITY: You and/or your care partner have signed paperwork which will be entered into your electronic medical record.  These signatures attest to the fact that that the information above on your After Visit Summary has been reviewed and is understood.  Full responsibility of the confidentiality of this discharge information lies with you  and/or your care-partner. 

## 2019-04-18 NOTE — Op Note (Signed)
[facility] Patient Name: [name] Procedure Date: 04/18/2019 8:40 AM MRN: [id number] Endoscopist: Ladene Artist , MD Age: 48 Referring MD:  Date of Birth: 1971-03-13 Gender: Female Account #: [id number] Procedure:                Upper GI endoscopy Indications:              Surveillance for malignancy due to personal history                            of Barrett's esophagus Medicines:                Monitored Anesthesia Care Procedure:                Pre-Anesthesia Assessment:                           - Prior to the procedure, a History and Physical                            was performed, and patient medications and                            allergies were reviewed. The patient's tolerance of                            previous anesthesia was also reviewed. The risks                            and benefits of the procedure and the sedation                            options and risks were discussed with the patient.                            All questions were answered, and informed consent                            was obtained. Prior Anticoagulants: The patient has                            taken no previous anticoagulant or antiplatelet                            agents. ASA Grade Assessment: II - A patient with                            mild systemic disease. After reviewing the risks                            and benefits, the patient was deemed in                            satisfactory condition to undergo the procedure.  After obtaining informed consent, the endoscope was                            passed under direct vision. Throughout the                            procedure, the patient's blood pressure, pulse, and                            oxygen saturations were monitored continuously. The                            Endoscope was introduced through the mouth, and                            advanced to the second part  of duodenum. The upper                            GI endoscopy was accomplished without difficulty.                            The patient tolerated the procedure well. Scope In: Scope Out: Findings:                 There were esophageal mucosal changes suggestive of                            short-segment Barrett's esophagus present in the                            distal esophagus. The maximum longitudinal extent                            of these mucosal changes was 1 cm in length                            extending from 38-39cm. Mucosa was biopsied with a                            cold forceps for histology in a targeted manner at                            intervals of 0.5 cm in the lower third of the                            esophagus. One specimen bottle was sent to                            pathology.                           The exam of the esophagus was otherwise normal.  A small hiatal hernia was present, 2 cm.                           Three 3 to 5 mm sessile polyps with no bleeding and                            no stigmata of recent bleeding were found in the                            gastric fundus and in the gastric body. Biopsies                            were taken with a cold forceps for histology.                           The exam of the stomach was otherwise normal.                           The duodenal bulb and second portion of the                            duodenum were normal. Complications:            No immediate complications. Estimated Blood Loss:     Estimated blood loss was minimal. Impression:               - Esophageal mucosal changes suggestive of                            short-segment Barrett's esophagus. Biopsied.                           - Small hiatal hernia.                           - Three gastric polyps. Biopsied.                           - Normal duodenal bulb and second portion of the                             duodenum. Recommendation:           - Patient has a contact number available for                            emergencies. The signs and symptoms of potential                            delayed complications were discussed with the                            patient. Return to normal activities tomorrow.                            Written  discharge instructions were provided to the                            patient.                           - Resume previous diet.                           - Antireflux measures.                           - Continue present medications.                           - Await pathology results.                           - Repeat upper endoscopy in 3 years for                            surveillance based on pathology results. Ladene Artist, MD 04/18/2019 9:18:34 AM This report has been signed electronically.

## 2019-04-18 NOTE — Progress Notes (Signed)
VS-DT Temp-LC  Pt's states no medical or surgical changes since previsit or office visit.  

## 2019-04-18 NOTE — Progress Notes (Signed)
Called to room to assist during endoscopic procedure.  Patient ID and intended procedure confirmed with present staff. Received instructions for my participation in the procedure from the performing physician.  

## 2019-04-20 ENCOUNTER — Telehealth: Payer: Self-pay

## 2019-04-20 NOTE — Telephone Encounter (Signed)
Attempted to reach pt. With follow-up call following endoscopic procedure 04/18/2019.  LM on pt. Voice mail.  Will try to reach pt. Again later today.

## 2019-04-20 NOTE — Telephone Encounter (Signed)
  Follow up Call-  Call back number 04/18/2019  Post procedure Call Back phone  # [phone]  Permission to leave phone message Yes  Some recent data might be hidden     Patient questions:  Do you have a fever, pain , or abdominal swelling? No. Pain Score  0 *  Have you tolerated food without any problems? Yes.    Have you been able to return to your normal activities? Yes.    Do you have any questions about your discharge instructions: Diet   No. Medications  No. Follow up visit  No.  Do you have questions or concerns about your Care? No.  Actions: * If pain score is 4 or above: No action needed, pain <4. 1. Have you developed a fever since your procedure? no  2.   Have you had an respiratory symptoms (SOB or cough) since your procedure? no  3.   Have you tested positive for COVID 19 since your procedure no  4.   Have you had any family members/close contacts diagnosed with the COVID 19 since your procedure?  no   If yes to any of these questions please route to Joylene John, RN and Alphonsa Gin, Therapist, sports.

## 2019-04-25 ENCOUNTER — Encounter: Payer: Self-pay | Admitting: Gastroenterology

## 2019-06-28 ENCOUNTER — Other Ambulatory Visit: Payer: Self-pay | Admitting: Sports Medicine

## 2019-06-28 DIAGNOSIS — M542 Cervicalgia: Secondary | ICD-10-CM

## 2019-07-12 NOTE — Progress Notes (Signed)
Cardiology Office Note   Date:  07/13/2019   ID:  [name], DOB 1971/04/26, MRN [id number]  PCP:  Burnard Bunting, MD  Cardiologist:   [name], MD   Pt presents for f/u of palpitations      History of Present Illness: [name] is a 49 y.o. female with a history of palpitations   In 2019 she had an echo that showed normal LVEF and RVEF  Interatrial septum was noted to be hypermobile and PFO was suspected   Bubble study confirmed that a large PFO was present   Mild MR  Pt went on to have a CT coronary angiogram.   Heart size was normal Calcium score was 0    Aorta normal     Coronary arteries normal, R dominant   Pulm venous drainage was normal   Small PFO noted   No evid for pulmonary HTN   TEE also done that showed PFO (July 2020)     the pt was seen by Ezzie Dural in AUg 2020  Given no neurologic history or hx sugg of orthodeoxia/platypnea syndrome he recomm continued low dose aspirin and avoiding estrogen replacement Rx    Since she was seen last summer she has done very well from a cardiac standpoint.  Breathing is good  She is running short to moderate distances   She has had rare palpitation.    Current Meds  Medication Sig  . aspirin EC 81 MG tablet Take 81 mg by mouth daily.  . Biotin 10 MG CAPS Take 10 mg by mouth daily.   . Calcium Carb-Cholecalciferol (CALCIUM 600/VITAMIN D3 PO) Take 1 tablet by mouth daily.  . cetirizine (ZYRTEC) 10 MG tablet Take 10 mg by mouth daily.  . Cholecalciferol (VITAMIN D3) 25 MCG (1000 UT) CAPS Take 1,000 Units by mouth daily.  . Multiple Vitamin (MULTIVITAMIN WITH MINERALS) TABS Take 1 tablet by mouth daily.  . Omega-3 Fatty Acids (FISH OIL PO) Take 4 capsules by mouth daily.  Marland Kitchen omeprazole (PRILOSEC) 20 MG capsule Take 20 mg by mouth daily.  . Probiotic Product (PROBIOTIC PO) Take 1 capsule by mouth daily.     Allergies:   Pollen extract and Quercus robur   Past Medical History:  Diagnosis Date  . Allergy    pollen  .  Barrett's syndrome   . Gastric polyp   . Heart murmur   . Hiatal hernia   . HNP (herniated nucleus pulposus with myelopathy), thoracic   . Lumbar disc disease   . Lumbar spondylosis   . PFO (patent foramen ovale)     Past Surgical History:  Procedure Laterality Date  . BUBBLE STUDY  11/01/2018   Procedure: BUBBLE STUDY;  Surgeon: Dorothy Spark, MD;  Location: [facility] ENDOSCOPY;  Service: Cardiovascular;;  . CESAREAN SECTION  2007, 2009   x 2  . COLONOSCOPY    . HEMILAMINOTOMY LUMBAR SPINE  09/2003  . KNEE ARTHROSCOPY Right   . MICRODISCECTOMY LUMBAR     L5-S1  . TEE WITHOUT CARDIOVERSION N/A 11/01/2018   Procedure: TRANSESOPHAGEAL ECHOCARDIOGRAM (TEE);  Surgeon: Dorothy Spark, MD;  Location: [facility] ENDOSCOPY;  Service: Cardiovascular;  Laterality: N/A;  . TUBAL LIGATION    . UPPER GASTROINTESTINAL ENDOSCOPY       Social History:  The patient  reports that she has never smoked. She has never used smokeless tobacco. She reports current alcohol use of about 1.0 standard drinks of alcohol per week. She reports that she  does not use drugs.   Family History:  The patient's family history includes Heart disease in her father; Hypertension in her father; Leukemia in her mother.    ROS:  Please see the history of present illness. All other systems are reviewed and  Negative to the above problem except as noted.    PHYSICAL EXAM: VS:  BP 132/86   Pulse 63   Ht 5' 3.5" (1.613 m)   Wt 133 lb 12.8 oz (60.7 kg)   BMI 23.33 kg/m   GEN: Well nourished, well developed, in no acute distress  HEENT: normal  Neck: JVP is normal  No  carotid bruits Cardiac: RRR; no murmurs, rubs, or gallops,no edema  Respiratory:  clear to auscultation bilaterally, normal work of breathing GI: soft, nontender, nondistended, + BS  No hepatomegaly  MS: no deformity Moving all extremities   Skin: warm and dry, no rash Neuro:  Grossly intact Psych: euthymic mood, full affect   EKG:  EKG is ordered today.    SR 63 bpm   incomp RBBB     Lipid Panel No results found for: CHOL, TRIG, HDL, CHOLHDL, VLDL, LDLCALC, LDLDIRECT    Wt Readings from Last 3 Encounters:  07/13/19 133 lb 12.8 oz (60.7 kg)  04/18/19 133 lb 9.6 oz (60.6 kg)  04/10/19 133 lb 9.6 oz (60.6 kg)      ASSESSMENT AND PLAN:  1  PFO  Pt without symptoms.   Would recomm continued low dose aspirin  2  Palpitations   Pt notes rare symptom  3  Mitral regurgitation   Mild   No murmur on exam.  Follow clinically and with periodic echoes  4 Lipids   Excellent  LDL 87  HDL 89     F/U in years with echo    Current medicines are reviewed at length with the patient today.  The patient does not have concerns regarding medicines.  Signed, [name], MD  07/13/2019 8:24 AM    [facility] [place], [place], [place]  [address] Phone: [phone]; Fax: [phone]

## 2019-07-13 ENCOUNTER — Other Ambulatory Visit: Payer: Self-pay

## 2019-07-13 ENCOUNTER — Encounter: Payer: Self-pay | Admitting: Internal Medicine

## 2019-07-13 ENCOUNTER — Ambulatory Visit: Payer: 59 | Admitting: Internal Medicine

## 2019-07-13 VITALS — BP 132/86 | HR 63 | Ht 63.5 in | Wt 133.8 lb

## 2019-07-13 DIAGNOSIS — R002 Palpitations: Secondary | ICD-10-CM

## 2019-07-13 NOTE — Patient Instructions (Addendum)
Medication Instructions:  No changes *If you need a refill on your cardiac medications before your next appointment, please call your pharmacy*   Lab Work: none If you have labs (blood work) drawn today and your tests are completely normal, you will receive your results only by: Marland Kitchen MyChart Message (if you have MyChart) OR . A paper copy in the mail If you have any lab test that is abnormal or we need to change your treatment, we will call you to review the results.   Testing/Procedures: none   Follow-Up: At [facility], you and your health needs are our priority.  As part of our continuing mission to provide you with exceptional heart care, we have created designated Provider Care Teams.  These Care Teams include your primary Cardiologist (physician) and Advanced Practice Providers (APPs -  Physician Assistants and Nurse Practitioners) who all work together to provide you with the care you need, when you need it.  Your next appointment:   24 month(s)  The format for your next appointment:   In Person  Provider:   [name], MD   Other Instructions  Echo prior to next appointment with Dr. Harrington Challenger in 07/2021. Can be right before or same day.

## 2019-07-25 ENCOUNTER — Other Ambulatory Visit: Payer: 59

## 2019-08-29 ENCOUNTER — Other Ambulatory Visit: Payer: Self-pay | Admitting: Obstetrics and Gynecology

## 2019-08-29 DIAGNOSIS — Z1231 Encounter for screening mammogram for malignant neoplasm of breast: Secondary | ICD-10-CM

## 2019-10-03 ENCOUNTER — Ambulatory Visit
Admission: RE | Admit: 2019-10-03 | Discharge: 2019-10-03 | Disposition: A | Discharge status: Home / Self Care | Payer: 59 | Source: Ambulatory Visit

## 2019-10-03 ENCOUNTER — Other Ambulatory Visit: Payer: Self-pay

## 2019-10-03 DIAGNOSIS — Z1231 Encounter for screening mammogram for malignant neoplasm of breast: Secondary | ICD-10-CM

## 2020-08-04 DIAGNOSIS — J3089 Other allergic rhinitis: Secondary | ICD-10-CM | POA: Diagnosis not present

## 2020-08-04 DIAGNOSIS — J3081 Allergic rhinitis due to animal (cat) (dog) hair and dander: Secondary | ICD-10-CM | POA: Diagnosis not present

## 2020-08-04 DIAGNOSIS — J301 Allergic rhinitis due to pollen: Secondary | ICD-10-CM | POA: Diagnosis not present

## 2020-08-13 DIAGNOSIS — J3081 Allergic rhinitis due to animal (cat) (dog) hair and dander: Secondary | ICD-10-CM | POA: Diagnosis not present

## 2020-08-13 DIAGNOSIS — J301 Allergic rhinitis due to pollen: Secondary | ICD-10-CM | POA: Diagnosis not present

## 2020-08-13 DIAGNOSIS — K200 Eosinophilic esophagitis: Secondary | ICD-10-CM | POA: Diagnosis not present

## 2020-08-13 DIAGNOSIS — J3089 Other allergic rhinitis: Secondary | ICD-10-CM | POA: Diagnosis not present

## 2020-08-18 DIAGNOSIS — J3089 Other allergic rhinitis: Secondary | ICD-10-CM | POA: Diagnosis not present

## 2020-08-18 DIAGNOSIS — J3081 Allergic rhinitis due to animal (cat) (dog) hair and dander: Secondary | ICD-10-CM | POA: Diagnosis not present

## 2020-08-18 DIAGNOSIS — J301 Allergic rhinitis due to pollen: Secondary | ICD-10-CM | POA: Diagnosis not present

## 2020-08-19 ENCOUNTER — Other Ambulatory Visit: Payer: Self-pay | Admitting: Obstetrics and Gynecology

## 2020-08-19 DIAGNOSIS — Z1231 Encounter for screening mammogram for malignant neoplasm of breast: Secondary | ICD-10-CM

## 2020-08-25 DIAGNOSIS — J301 Allergic rhinitis due to pollen: Secondary | ICD-10-CM | POA: Diagnosis not present

## 2020-08-25 DIAGNOSIS — J3089 Other allergic rhinitis: Secondary | ICD-10-CM | POA: Diagnosis not present

## 2020-09-01 DIAGNOSIS — J301 Allergic rhinitis due to pollen: Secondary | ICD-10-CM | POA: Diagnosis not present

## 2020-09-01 DIAGNOSIS — J3089 Other allergic rhinitis: Secondary | ICD-10-CM | POA: Diagnosis not present

## 2020-09-04 DIAGNOSIS — J301 Allergic rhinitis due to pollen: Secondary | ICD-10-CM | POA: Diagnosis not present

## 2020-09-04 DIAGNOSIS — J3089 Other allergic rhinitis: Secondary | ICD-10-CM | POA: Diagnosis not present

## 2020-09-04 DIAGNOSIS — J3081 Allergic rhinitis due to animal (cat) (dog) hair and dander: Secondary | ICD-10-CM | POA: Diagnosis not present

## 2020-09-11 DIAGNOSIS — J301 Allergic rhinitis due to pollen: Secondary | ICD-10-CM | POA: Diagnosis not present

## 2020-09-11 DIAGNOSIS — J3089 Other allergic rhinitis: Secondary | ICD-10-CM | POA: Diagnosis not present

## 2020-09-11 DIAGNOSIS — J3081 Allergic rhinitis due to animal (cat) (dog) hair and dander: Secondary | ICD-10-CM | POA: Diagnosis not present

## 2020-09-15 DIAGNOSIS — J3089 Other allergic rhinitis: Secondary | ICD-10-CM | POA: Diagnosis not present

## 2020-09-15 DIAGNOSIS — J301 Allergic rhinitis due to pollen: Secondary | ICD-10-CM | POA: Diagnosis not present

## 2020-09-15 DIAGNOSIS — J3081 Allergic rhinitis due to animal (cat) (dog) hair and dander: Secondary | ICD-10-CM | POA: Diagnosis not present

## 2020-09-17 DIAGNOSIS — M25522 Pain in left elbow: Secondary | ICD-10-CM | POA: Diagnosis not present

## 2020-09-17 DIAGNOSIS — M25512 Pain in left shoulder: Secondary | ICD-10-CM | POA: Diagnosis not present

## 2020-09-21 DIAGNOSIS — Z20822 Contact with and (suspected) exposure to covid-19: Secondary | ICD-10-CM | POA: Diagnosis not present

## 2020-09-30 DIAGNOSIS — J301 Allergic rhinitis due to pollen: Secondary | ICD-10-CM | POA: Diagnosis not present

## 2020-09-30 DIAGNOSIS — J3089 Other allergic rhinitis: Secondary | ICD-10-CM | POA: Diagnosis not present

## 2020-09-30 DIAGNOSIS — J3081 Allergic rhinitis due to animal (cat) (dog) hair and dander: Secondary | ICD-10-CM | POA: Diagnosis not present

## 2020-10-06 DIAGNOSIS — J3081 Allergic rhinitis due to animal (cat) (dog) hair and dander: Secondary | ICD-10-CM | POA: Diagnosis not present

## 2020-10-06 DIAGNOSIS — J3089 Other allergic rhinitis: Secondary | ICD-10-CM | POA: Diagnosis not present

## 2020-10-06 DIAGNOSIS — J301 Allergic rhinitis due to pollen: Secondary | ICD-10-CM | POA: Diagnosis not present

## 2020-10-07 ENCOUNTER — Other Ambulatory Visit: Payer: Self-pay

## 2020-10-07 ENCOUNTER — Ambulatory Visit
Admission: RE | Admit: 2020-10-07 | Discharge: 2020-10-07 | Disposition: A | Discharge status: Home / Self Care | Payer: BC Managed Care – PPO | Source: Ambulatory Visit | Attending: Obstetrics and Gynecology | Admitting: Obstetrics and Gynecology

## 2020-10-07 DIAGNOSIS — Z1231 Encounter for screening mammogram for malignant neoplasm of breast: Secondary | ICD-10-CM

## 2020-10-13 DIAGNOSIS — J3081 Allergic rhinitis due to animal (cat) (dog) hair and dander: Secondary | ICD-10-CM | POA: Diagnosis not present

## 2020-10-13 DIAGNOSIS — J301 Allergic rhinitis due to pollen: Secondary | ICD-10-CM | POA: Diagnosis not present

## 2020-10-13 DIAGNOSIS — J3089 Other allergic rhinitis: Secondary | ICD-10-CM | POA: Diagnosis not present

## 2020-10-21 DIAGNOSIS — J3081 Allergic rhinitis due to animal (cat) (dog) hair and dander: Secondary | ICD-10-CM | POA: Diagnosis not present

## 2020-10-21 DIAGNOSIS — J3089 Other allergic rhinitis: Secondary | ICD-10-CM | POA: Diagnosis not present

## 2020-10-21 DIAGNOSIS — J301 Allergic rhinitis due to pollen: Secondary | ICD-10-CM | POA: Diagnosis not present

## 2020-11-04 DIAGNOSIS — J3081 Allergic rhinitis due to animal (cat) (dog) hair and dander: Secondary | ICD-10-CM | POA: Diagnosis not present

## 2020-11-04 DIAGNOSIS — J3089 Other allergic rhinitis: Secondary | ICD-10-CM | POA: Diagnosis not present

## 2020-11-04 DIAGNOSIS — J301 Allergic rhinitis due to pollen: Secondary | ICD-10-CM | POA: Diagnosis not present

## 2020-11-05 DIAGNOSIS — M5412 Radiculopathy, cervical region: Secondary | ICD-10-CM | POA: Diagnosis not present

## 2020-11-06 DIAGNOSIS — M542 Cervicalgia: Secondary | ICD-10-CM | POA: Diagnosis not present

## 2020-11-10 DIAGNOSIS — J3081 Allergic rhinitis due to animal (cat) (dog) hair and dander: Secondary | ICD-10-CM | POA: Diagnosis not present

## 2020-11-10 DIAGNOSIS — J301 Allergic rhinitis due to pollen: Secondary | ICD-10-CM | POA: Diagnosis not present

## 2020-11-10 DIAGNOSIS — J3089 Other allergic rhinitis: Secondary | ICD-10-CM | POA: Diagnosis not present

## 2020-11-12 DIAGNOSIS — Z0000 Encounter for general adult medical examination without abnormal findings: Secondary | ICD-10-CM | POA: Diagnosis not present

## 2020-11-14 DIAGNOSIS — Z79899 Other long term (current) drug therapy: Secondary | ICD-10-CM | POA: Diagnosis not present

## 2020-11-17 DIAGNOSIS — J3089 Other allergic rhinitis: Secondary | ICD-10-CM | POA: Diagnosis not present

## 2020-11-17 DIAGNOSIS — J3081 Allergic rhinitis due to animal (cat) (dog) hair and dander: Secondary | ICD-10-CM | POA: Diagnosis not present

## 2020-11-17 DIAGNOSIS — J301 Allergic rhinitis due to pollen: Secondary | ICD-10-CM | POA: Diagnosis not present

## 2020-11-19 DIAGNOSIS — G5603 Carpal tunnel syndrome, bilateral upper limbs: Secondary | ICD-10-CM | POA: Diagnosis not present

## 2020-11-19 DIAGNOSIS — M4802 Spinal stenosis, cervical region: Secondary | ICD-10-CM | POA: Diagnosis not present

## 2020-11-19 DIAGNOSIS — M5412 Radiculopathy, cervical region: Secondary | ICD-10-CM | POA: Diagnosis not present

## 2020-11-19 DIAGNOSIS — M542 Cervicalgia: Secondary | ICD-10-CM | POA: Diagnosis not present

## 2020-11-24 DIAGNOSIS — J3089 Other allergic rhinitis: Secondary | ICD-10-CM | POA: Diagnosis not present

## 2020-11-24 DIAGNOSIS — J301 Allergic rhinitis due to pollen: Secondary | ICD-10-CM | POA: Diagnosis not present

## 2020-11-24 DIAGNOSIS — J3081 Allergic rhinitis due to animal (cat) (dog) hair and dander: Secondary | ICD-10-CM | POA: Diagnosis not present

## 2020-11-26 DIAGNOSIS — K219 Gastro-esophageal reflux disease without esophagitis: Secondary | ICD-10-CM | POA: Diagnosis not present

## 2020-11-26 DIAGNOSIS — R7301 Impaired fasting glucose: Secondary | ICD-10-CM | POA: Diagnosis not present

## 2020-11-26 DIAGNOSIS — Z0000 Encounter for general adult medical examination without abnormal findings: Secondary | ICD-10-CM | POA: Diagnosis not present

## 2020-11-26 DIAGNOSIS — Z1339 Encounter for screening examination for other mental health and behavioral disorders: Secondary | ICD-10-CM | POA: Diagnosis not present

## 2020-11-26 DIAGNOSIS — Z1331 Encounter for screening for depression: Secondary | ICD-10-CM | POA: Diagnosis not present

## 2020-11-26 DIAGNOSIS — R82998 Other abnormal findings in urine: Secondary | ICD-10-CM | POA: Diagnosis not present

## 2020-12-01 DIAGNOSIS — J3089 Other allergic rhinitis: Secondary | ICD-10-CM | POA: Diagnosis not present

## 2020-12-01 DIAGNOSIS — J3081 Allergic rhinitis due to animal (cat) (dog) hair and dander: Secondary | ICD-10-CM | POA: Diagnosis not present

## 2020-12-01 DIAGNOSIS — J301 Allergic rhinitis due to pollen: Secondary | ICD-10-CM | POA: Diagnosis not present

## 2020-12-01 HISTORY — PX: ANTERIOR CERVICAL DECOMP/DISCECTOMY FUSION: SHX1161

## 2020-12-04 DIAGNOSIS — M4802 Spinal stenosis, cervical region: Secondary | ICD-10-CM | POA: Diagnosis not present

## 2020-12-04 DIAGNOSIS — R030 Elevated blood-pressure reading, without diagnosis of hypertension: Secondary | ICD-10-CM | POA: Diagnosis not present

## 2020-12-04 DIAGNOSIS — M5412 Radiculopathy, cervical region: Secondary | ICD-10-CM | POA: Diagnosis not present

## 2020-12-08 DIAGNOSIS — M4722 Other spondylosis with radiculopathy, cervical region: Secondary | ICD-10-CM | POA: Diagnosis not present

## 2020-12-08 DIAGNOSIS — M50223 Other cervical disc displacement at C6-C7 level: Secondary | ICD-10-CM | POA: Diagnosis not present

## 2020-12-08 DIAGNOSIS — M50222 Other cervical disc displacement at C5-C6 level: Secondary | ICD-10-CM | POA: Diagnosis not present

## 2020-12-08 DIAGNOSIS — M50122 Cervical disc disorder at C5-C6 level with radiculopathy: Secondary | ICD-10-CM | POA: Diagnosis not present

## 2020-12-08 HISTORY — PX: OTHER SURGICAL HISTORY: SHX169

## 2020-12-15 DIAGNOSIS — J3081 Allergic rhinitis due to animal (cat) (dog) hair and dander: Secondary | ICD-10-CM | POA: Diagnosis not present

## 2020-12-15 DIAGNOSIS — J301 Allergic rhinitis due to pollen: Secondary | ICD-10-CM | POA: Diagnosis not present

## 2020-12-15 DIAGNOSIS — J3089 Other allergic rhinitis: Secondary | ICD-10-CM | POA: Diagnosis not present

## 2020-12-22 DIAGNOSIS — J3089 Other allergic rhinitis: Secondary | ICD-10-CM | POA: Diagnosis not present

## 2020-12-22 DIAGNOSIS — J3081 Allergic rhinitis due to animal (cat) (dog) hair and dander: Secondary | ICD-10-CM | POA: Diagnosis not present

## 2020-12-22 DIAGNOSIS — J301 Allergic rhinitis due to pollen: Secondary | ICD-10-CM | POA: Diagnosis not present

## 2020-12-24 DIAGNOSIS — J3089 Other allergic rhinitis: Secondary | ICD-10-CM | POA: Diagnosis not present

## 2020-12-29 DIAGNOSIS — J3081 Allergic rhinitis due to animal (cat) (dog) hair and dander: Secondary | ICD-10-CM | POA: Diagnosis not present

## 2020-12-29 DIAGNOSIS — J301 Allergic rhinitis due to pollen: Secondary | ICD-10-CM | POA: Diagnosis not present

## 2020-12-29 DIAGNOSIS — M542 Cervicalgia: Secondary | ICD-10-CM | POA: Diagnosis not present

## 2020-12-29 DIAGNOSIS — J3089 Other allergic rhinitis: Secondary | ICD-10-CM | POA: Diagnosis not present

## 2020-12-31 DIAGNOSIS — Z01419 Encounter for gynecological examination (general) (routine) without abnormal findings: Secondary | ICD-10-CM | POA: Diagnosis not present

## 2020-12-31 DIAGNOSIS — N952 Postmenopausal atrophic vaginitis: Secondary | ICD-10-CM | POA: Diagnosis not present

## 2021-01-07 DIAGNOSIS — J301 Allergic rhinitis due to pollen: Secondary | ICD-10-CM | POA: Diagnosis not present

## 2021-01-07 DIAGNOSIS — J3089 Other allergic rhinitis: Secondary | ICD-10-CM | POA: Diagnosis not present

## 2021-01-07 DIAGNOSIS — J3081 Allergic rhinitis due to animal (cat) (dog) hair and dander: Secondary | ICD-10-CM | POA: Diagnosis not present

## 2021-01-14 DIAGNOSIS — J3081 Allergic rhinitis due to animal (cat) (dog) hair and dander: Secondary | ICD-10-CM | POA: Diagnosis not present

## 2021-01-14 DIAGNOSIS — J3089 Other allergic rhinitis: Secondary | ICD-10-CM | POA: Diagnosis not present

## 2021-01-14 DIAGNOSIS — J301 Allergic rhinitis due to pollen: Secondary | ICD-10-CM | POA: Diagnosis not present

## 2021-01-19 DIAGNOSIS — J301 Allergic rhinitis due to pollen: Secondary | ICD-10-CM | POA: Diagnosis not present

## 2021-01-19 DIAGNOSIS — J3089 Other allergic rhinitis: Secondary | ICD-10-CM | POA: Diagnosis not present

## 2021-01-19 DIAGNOSIS — J3081 Allergic rhinitis due to animal (cat) (dog) hair and dander: Secondary | ICD-10-CM | POA: Diagnosis not present

## 2021-01-23 DIAGNOSIS — J3089 Other allergic rhinitis: Secondary | ICD-10-CM | POA: Diagnosis not present

## 2021-01-26 DIAGNOSIS — J301 Allergic rhinitis due to pollen: Secondary | ICD-10-CM | POA: Diagnosis not present

## 2021-01-26 DIAGNOSIS — J3081 Allergic rhinitis due to animal (cat) (dog) hair and dander: Secondary | ICD-10-CM | POA: Diagnosis not present

## 2021-01-26 DIAGNOSIS — J3089 Other allergic rhinitis: Secondary | ICD-10-CM | POA: Diagnosis not present

## 2021-02-02 DIAGNOSIS — J3081 Allergic rhinitis due to animal (cat) (dog) hair and dander: Secondary | ICD-10-CM | POA: Diagnosis not present

## 2021-02-02 DIAGNOSIS — J3089 Other allergic rhinitis: Secondary | ICD-10-CM | POA: Diagnosis not present

## 2021-02-02 DIAGNOSIS — J301 Allergic rhinitis due to pollen: Secondary | ICD-10-CM | POA: Diagnosis not present

## 2021-02-04 ENCOUNTER — Encounter: Payer: Self-pay | Admitting: Gastroenterology

## 2021-02-04 ENCOUNTER — Ambulatory Visit (INDEPENDENT_AMBULATORY_CARE_PROVIDER_SITE_OTHER): Payer: BC Managed Care – PPO | Admitting: Gastroenterology

## 2021-02-04 VITALS — BP 120/82 | HR 70 | Ht 63.5 in | Wt 140.2 lb

## 2021-02-04 DIAGNOSIS — K2270 Barrett's esophagus without dysplasia: Secondary | ICD-10-CM | POA: Diagnosis not present

## 2021-02-04 DIAGNOSIS — K602 Anal fissure, unspecified: Secondary | ICD-10-CM

## 2021-02-04 DIAGNOSIS — K921 Melena: Secondary | ICD-10-CM

## 2021-02-04 MED ORDER — DILTIAZEM GEL 2 %: 1.0000 "application " | Freq: Three times a day (TID) | CUTANEOUS | 0 refills | Status: DC

## 2021-02-04 NOTE — Patient Instructions (Signed)
We have sent a prescription for diltiazem 2% gel to [facility]. You should apply a pea size amount to your rectum three times daily x 8 weeks.  [facility]'s information is below: Address: [address] [place], [place] [address]  [phone]  *Please DO NOT go directly from our office to pick up this medication! Give the pharmacy 1 day to process the prescription as this is compounded and takes time to make.  Call if your symptoms are not better in 4 weeks.   Normal BMI (Body Mass Index- based on height and weight) is between 19 and 25. Your BMI today is Body mass index is 24.45 kg/m. Marland Kitchen Please consider follow up  regarding your BMI with your Primary Care Provider.  The [facility] providers would like to encourage you to use [facility] to communicate with providers for non-urgent requests or questions.  Due to long hold times on the telephone, sending your provider a message by [facility] may be a faster and more efficient way to get a response.  Please allow 48 business hours for a response.  Please remember that this is for non-urgent requests.   Thank you for choosing me and [facility].  Pricilla Riffle. Dagoberto Ligas., MD., Marval Regal

## 2021-02-04 NOTE — Progress Notes (Signed)
    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 She has a history of an anal fissure and internal hemorrhoids.  She notes a couple weeks of anal pain following defecation and has noted a couple episodes of small-volume bleeding with bowel movements.  She has regular bowel movements about twice daily without straining.  There has been no change in her bowel pattern.  Her reflux symptoms are well controlled on omeprazole.  No other gastrointestinal complaints.  She recently underwent an cervical spine surgery with an anterior approach by Dr. Vertell Limber and she is making a very good recovery.  Colonoscopy 01/2016 - Two 4 to 5 mm polyps in the cecum. Path: polypoid mucosa  - Internal hemorrhoids. - The examination was otherwise normal on direct and retroflexion views.  Current Medications, Allergies, Past Medical History, Past Surgical History, Family History and Social History were reviewed in [employer] record.   Physical Exam: General: Well developed, well nourished, no acute distress Head: Normocephalic and atraumatic Eyes: Sclerae anicteric, EOMI Ears: Normal auditory acuity Mouth: Not examined, mask on during Covid-19 pandemic Lungs: Clear throughout to auscultation Heart: Regular rate and rhythm; no murmurs, rubs or bruits Abdomen: Soft, non tender and non distended. No masses, hepatosplenomegaly or hernias noted. Normal Bowel sounds Rectal: Small anterior anal fissure, small external skin tags, anal canal tenderness, without other lesions, no stool for Hemoccult Musculoskeletal: Symmetrical with no gross deformities  Pulses:  Normal pulses noted Extremities: No clubbing, cyanosis, edema or deformities noted Neurological: Alert oriented x 4, grossly nonfocal Psychological:  Alert and cooperative. Normal mood and affect   Assessment and Recommendations:  Hematochezia. Small anal fissure.   Diltiazem 2% gel PR 3 times daily for 8 weeks.  If symptoms have not resolved in 4 to 6 weeks she is advised to call for further evaluation and plans. Barrett's esophagus, short segment, without dysplasia.  Barrett's noted on pathology in 2017 but not in 2020. Follow antireflux measures. Continue omeprazole 20 mg po qd. Surveillance EGD is recommended in in December 2023. CRC screening, average risk.  A 10-year interval screening colonoscopy is recommended in September 2027.

## 2021-02-09 DIAGNOSIS — J301 Allergic rhinitis due to pollen: Secondary | ICD-10-CM | POA: Diagnosis not present

## 2021-02-09 DIAGNOSIS — J3089 Other allergic rhinitis: Secondary | ICD-10-CM | POA: Diagnosis not present

## 2021-02-09 DIAGNOSIS — J3081 Allergic rhinitis due to animal (cat) (dog) hair and dander: Secondary | ICD-10-CM | POA: Diagnosis not present

## 2021-02-16 DIAGNOSIS — J3081 Allergic rhinitis due to animal (cat) (dog) hair and dander: Secondary | ICD-10-CM | POA: Diagnosis not present

## 2021-02-16 DIAGNOSIS — J3089 Other allergic rhinitis: Secondary | ICD-10-CM | POA: Diagnosis not present

## 2021-02-16 DIAGNOSIS — J301 Allergic rhinitis due to pollen: Secondary | ICD-10-CM | POA: Diagnosis not present

## 2021-02-17 DIAGNOSIS — L821 Other seborrheic keratosis: Secondary | ICD-10-CM | POA: Diagnosis not present

## 2021-02-17 DIAGNOSIS — D485 Neoplasm of uncertain behavior of skin: Secondary | ICD-10-CM | POA: Diagnosis not present

## 2021-02-17 DIAGNOSIS — L929 Granulomatous disorder of the skin and subcutaneous tissue, unspecified: Secondary | ICD-10-CM | POA: Diagnosis not present

## 2021-02-17 DIAGNOSIS — D225 Melanocytic nevi of trunk: Secondary | ICD-10-CM | POA: Diagnosis not present

## 2021-02-17 DIAGNOSIS — L710 Perioral dermatitis: Secondary | ICD-10-CM | POA: Diagnosis not present

## 2021-02-17 DIAGNOSIS — L84 Corns and callosities: Secondary | ICD-10-CM | POA: Diagnosis not present

## 2021-02-18 DIAGNOSIS — M5412 Radiculopathy, cervical region: Secondary | ICD-10-CM | POA: Diagnosis not present

## 2021-02-23 DIAGNOSIS — J301 Allergic rhinitis due to pollen: Secondary | ICD-10-CM | POA: Diagnosis not present

## 2021-02-23 DIAGNOSIS — J3081 Allergic rhinitis due to animal (cat) (dog) hair and dander: Secondary | ICD-10-CM | POA: Diagnosis not present

## 2021-02-23 DIAGNOSIS — Z23 Encounter for immunization: Secondary | ICD-10-CM | POA: Diagnosis not present

## 2021-02-23 DIAGNOSIS — J3089 Other allergic rhinitis: Secondary | ICD-10-CM | POA: Diagnosis not present

## 2021-03-02 DIAGNOSIS — J301 Allergic rhinitis due to pollen: Secondary | ICD-10-CM | POA: Diagnosis not present

## 2021-03-02 DIAGNOSIS — J3081 Allergic rhinitis due to animal (cat) (dog) hair and dander: Secondary | ICD-10-CM | POA: Diagnosis not present

## 2021-03-02 DIAGNOSIS — J3089 Other allergic rhinitis: Secondary | ICD-10-CM | POA: Diagnosis not present

## 2021-03-11 DIAGNOSIS — J3089 Other allergic rhinitis: Secondary | ICD-10-CM | POA: Diagnosis not present

## 2021-03-11 DIAGNOSIS — J3081 Allergic rhinitis due to animal (cat) (dog) hair and dander: Secondary | ICD-10-CM | POA: Diagnosis not present

## 2021-03-11 DIAGNOSIS — J301 Allergic rhinitis due to pollen: Secondary | ICD-10-CM | POA: Diagnosis not present

## 2021-03-16 DIAGNOSIS — J301 Allergic rhinitis due to pollen: Secondary | ICD-10-CM | POA: Diagnosis not present

## 2021-03-16 DIAGNOSIS — J3089 Other allergic rhinitis: Secondary | ICD-10-CM | POA: Diagnosis not present

## 2021-03-16 DIAGNOSIS — J3081 Allergic rhinitis due to animal (cat) (dog) hair and dander: Secondary | ICD-10-CM | POA: Diagnosis not present

## 2021-03-23 DIAGNOSIS — J3081 Allergic rhinitis due to animal (cat) (dog) hair and dander: Secondary | ICD-10-CM | POA: Diagnosis not present

## 2021-03-23 DIAGNOSIS — J301 Allergic rhinitis due to pollen: Secondary | ICD-10-CM | POA: Diagnosis not present

## 2021-03-23 DIAGNOSIS — J3089 Other allergic rhinitis: Secondary | ICD-10-CM | POA: Diagnosis not present

## 2021-03-30 DIAGNOSIS — J301 Allergic rhinitis due to pollen: Secondary | ICD-10-CM | POA: Diagnosis not present

## 2021-03-30 DIAGNOSIS — J3089 Other allergic rhinitis: Secondary | ICD-10-CM | POA: Diagnosis not present

## 2021-03-30 DIAGNOSIS — J3081 Allergic rhinitis due to animal (cat) (dog) hair and dander: Secondary | ICD-10-CM | POA: Diagnosis not present

## 2021-04-06 DIAGNOSIS — J3089 Other allergic rhinitis: Secondary | ICD-10-CM | POA: Diagnosis not present

## 2021-04-09 DIAGNOSIS — J3081 Allergic rhinitis due to animal (cat) (dog) hair and dander: Secondary | ICD-10-CM | POA: Diagnosis not present

## 2021-04-09 DIAGNOSIS — J301 Allergic rhinitis due to pollen: Secondary | ICD-10-CM | POA: Diagnosis not present

## 2021-04-13 DIAGNOSIS — J301 Allergic rhinitis due to pollen: Secondary | ICD-10-CM | POA: Diagnosis not present

## 2021-04-13 DIAGNOSIS — J3089 Other allergic rhinitis: Secondary | ICD-10-CM | POA: Diagnosis not present

## 2021-04-13 DIAGNOSIS — J3081 Allergic rhinitis due to animal (cat) (dog) hair and dander: Secondary | ICD-10-CM | POA: Diagnosis not present

## 2021-04-15 DIAGNOSIS — M5412 Radiculopathy, cervical region: Secondary | ICD-10-CM | POA: Diagnosis not present

## 2021-04-15 DIAGNOSIS — M5020 Other cervical disc displacement, unspecified cervical region: Secondary | ICD-10-CM | POA: Diagnosis not present

## 2021-04-15 DIAGNOSIS — M4802 Spinal stenosis, cervical region: Secondary | ICD-10-CM | POA: Diagnosis not present

## 2021-04-15 DIAGNOSIS — M542 Cervicalgia: Secondary | ICD-10-CM | POA: Diagnosis not present

## 2021-04-20 DIAGNOSIS — J3081 Allergic rhinitis due to animal (cat) (dog) hair and dander: Secondary | ICD-10-CM | POA: Diagnosis not present

## 2021-04-20 DIAGNOSIS — J301 Allergic rhinitis due to pollen: Secondary | ICD-10-CM | POA: Diagnosis not present

## 2021-04-20 DIAGNOSIS — J3089 Other allergic rhinitis: Secondary | ICD-10-CM | POA: Diagnosis not present

## 2021-04-24 DIAGNOSIS — J301 Allergic rhinitis due to pollen: Secondary | ICD-10-CM | POA: Diagnosis not present

## 2021-04-24 DIAGNOSIS — J3081 Allergic rhinitis due to animal (cat) (dog) hair and dander: Secondary | ICD-10-CM | POA: Diagnosis not present

## 2021-05-06 DIAGNOSIS — J301 Allergic rhinitis due to pollen: Secondary | ICD-10-CM | POA: Diagnosis not present

## 2021-05-06 DIAGNOSIS — S83242A Other tear of medial meniscus, current injury, left knee, initial encounter: Secondary | ICD-10-CM | POA: Diagnosis not present

## 2021-05-06 DIAGNOSIS — J3081 Allergic rhinitis due to animal (cat) (dog) hair and dander: Secondary | ICD-10-CM | POA: Diagnosis not present

## 2021-05-06 DIAGNOSIS — J3089 Other allergic rhinitis: Secondary | ICD-10-CM | POA: Diagnosis not present

## 2021-05-11 DIAGNOSIS — J301 Allergic rhinitis due to pollen: Secondary | ICD-10-CM | POA: Diagnosis not present

## 2021-05-11 DIAGNOSIS — J3081 Allergic rhinitis due to animal (cat) (dog) hair and dander: Secondary | ICD-10-CM | POA: Diagnosis not present

## 2021-05-11 DIAGNOSIS — J3089 Other allergic rhinitis: Secondary | ICD-10-CM | POA: Diagnosis not present

## 2021-05-12 DIAGNOSIS — M25562 Pain in left knee: Secondary | ICD-10-CM | POA: Diagnosis not present

## 2021-05-20 DIAGNOSIS — J3081 Allergic rhinitis due to animal (cat) (dog) hair and dander: Secondary | ICD-10-CM | POA: Diagnosis not present

## 2021-05-20 DIAGNOSIS — J301 Allergic rhinitis due to pollen: Secondary | ICD-10-CM | POA: Diagnosis not present

## 2021-05-20 DIAGNOSIS — J3089 Other allergic rhinitis: Secondary | ICD-10-CM | POA: Diagnosis not present

## 2021-05-27 DIAGNOSIS — J3081 Allergic rhinitis due to animal (cat) (dog) hair and dander: Secondary | ICD-10-CM | POA: Diagnosis not present

## 2021-05-27 DIAGNOSIS — J301 Allergic rhinitis due to pollen: Secondary | ICD-10-CM | POA: Diagnosis not present

## 2021-05-27 DIAGNOSIS — J3089 Other allergic rhinitis: Secondary | ICD-10-CM | POA: Diagnosis not present

## 2021-06-01 DIAGNOSIS — J301 Allergic rhinitis due to pollen: Secondary | ICD-10-CM | POA: Diagnosis not present

## 2021-06-01 DIAGNOSIS — J3081 Allergic rhinitis due to animal (cat) (dog) hair and dander: Secondary | ICD-10-CM | POA: Diagnosis not present

## 2021-06-01 DIAGNOSIS — J3089 Other allergic rhinitis: Secondary | ICD-10-CM | POA: Diagnosis not present

## 2021-06-04 DIAGNOSIS — J3089 Other allergic rhinitis: Secondary | ICD-10-CM | POA: Diagnosis not present

## 2021-06-08 DIAGNOSIS — J3081 Allergic rhinitis due to animal (cat) (dog) hair and dander: Secondary | ICD-10-CM | POA: Diagnosis not present

## 2021-06-08 DIAGNOSIS — J301 Allergic rhinitis due to pollen: Secondary | ICD-10-CM | POA: Diagnosis not present

## 2021-06-08 DIAGNOSIS — J3089 Other allergic rhinitis: Secondary | ICD-10-CM | POA: Diagnosis not present

## 2021-06-15 DIAGNOSIS — J301 Allergic rhinitis due to pollen: Secondary | ICD-10-CM | POA: Diagnosis not present

## 2021-06-15 DIAGNOSIS — J3081 Allergic rhinitis due to animal (cat) (dog) hair and dander: Secondary | ICD-10-CM | POA: Diagnosis not present

## 2021-06-15 DIAGNOSIS — J3089 Other allergic rhinitis: Secondary | ICD-10-CM | POA: Diagnosis not present

## 2021-06-22 DIAGNOSIS — J3089 Other allergic rhinitis: Secondary | ICD-10-CM | POA: Diagnosis not present

## 2021-06-22 DIAGNOSIS — J3081 Allergic rhinitis due to animal (cat) (dog) hair and dander: Secondary | ICD-10-CM | POA: Diagnosis not present

## 2021-06-22 DIAGNOSIS — J301 Allergic rhinitis due to pollen: Secondary | ICD-10-CM | POA: Diagnosis not present

## 2021-07-01 DIAGNOSIS — J3089 Other allergic rhinitis: Secondary | ICD-10-CM | POA: Diagnosis not present

## 2021-07-01 DIAGNOSIS — J3081 Allergic rhinitis due to animal (cat) (dog) hair and dander: Secondary | ICD-10-CM | POA: Diagnosis not present

## 2021-07-01 DIAGNOSIS — J301 Allergic rhinitis due to pollen: Secondary | ICD-10-CM | POA: Diagnosis not present

## 2021-07-06 DIAGNOSIS — J301 Allergic rhinitis due to pollen: Secondary | ICD-10-CM | POA: Diagnosis not present

## 2021-07-06 DIAGNOSIS — J3081 Allergic rhinitis due to animal (cat) (dog) hair and dander: Secondary | ICD-10-CM | POA: Diagnosis not present

## 2021-07-06 DIAGNOSIS — J3089 Other allergic rhinitis: Secondary | ICD-10-CM | POA: Diagnosis not present

## 2021-07-13 DIAGNOSIS — J3081 Allergic rhinitis due to animal (cat) (dog) hair and dander: Secondary | ICD-10-CM | POA: Diagnosis not present

## 2021-07-13 DIAGNOSIS — J301 Allergic rhinitis due to pollen: Secondary | ICD-10-CM | POA: Diagnosis not present

## 2021-07-13 DIAGNOSIS — J3089 Other allergic rhinitis: Secondary | ICD-10-CM | POA: Diagnosis not present

## 2021-07-15 DIAGNOSIS — M1712 Unilateral primary osteoarthritis, left knee: Secondary | ICD-10-CM | POA: Diagnosis not present

## 2021-07-20 DIAGNOSIS — J301 Allergic rhinitis due to pollen: Secondary | ICD-10-CM | POA: Diagnosis not present

## 2021-07-20 DIAGNOSIS — J3081 Allergic rhinitis due to animal (cat) (dog) hair and dander: Secondary | ICD-10-CM | POA: Diagnosis not present

## 2021-07-20 DIAGNOSIS — J3089 Other allergic rhinitis: Secondary | ICD-10-CM | POA: Diagnosis not present

## 2021-07-22 DIAGNOSIS — M1712 Unilateral primary osteoarthritis, left knee: Secondary | ICD-10-CM | POA: Diagnosis not present

## 2021-07-27 DIAGNOSIS — J301 Allergic rhinitis due to pollen: Secondary | ICD-10-CM | POA: Diagnosis not present

## 2021-07-27 DIAGNOSIS — J3089 Other allergic rhinitis: Secondary | ICD-10-CM | POA: Diagnosis not present

## 2021-07-27 DIAGNOSIS — J3081 Allergic rhinitis due to animal (cat) (dog) hair and dander: Secondary | ICD-10-CM | POA: Diagnosis not present

## 2021-07-29 DIAGNOSIS — M1712 Unilateral primary osteoarthritis, left knee: Secondary | ICD-10-CM | POA: Diagnosis not present

## 2021-08-03 DIAGNOSIS — J3081 Allergic rhinitis due to animal (cat) (dog) hair and dander: Secondary | ICD-10-CM | POA: Diagnosis not present

## 2021-08-03 DIAGNOSIS — J301 Allergic rhinitis due to pollen: Secondary | ICD-10-CM | POA: Diagnosis not present

## 2021-08-03 DIAGNOSIS — J3089 Other allergic rhinitis: Secondary | ICD-10-CM | POA: Diagnosis not present

## 2021-08-10 DIAGNOSIS — J3089 Other allergic rhinitis: Secondary | ICD-10-CM | POA: Diagnosis not present

## 2021-08-10 DIAGNOSIS — J301 Allergic rhinitis due to pollen: Secondary | ICD-10-CM | POA: Diagnosis not present

## 2021-08-10 DIAGNOSIS — J3081 Allergic rhinitis due to animal (cat) (dog) hair and dander: Secondary | ICD-10-CM | POA: Diagnosis not present

## 2021-08-19 DIAGNOSIS — J3081 Allergic rhinitis due to animal (cat) (dog) hair and dander: Secondary | ICD-10-CM | POA: Diagnosis not present

## 2021-08-19 DIAGNOSIS — J301 Allergic rhinitis due to pollen: Secondary | ICD-10-CM | POA: Diagnosis not present

## 2021-08-19 DIAGNOSIS — J3089 Other allergic rhinitis: Secondary | ICD-10-CM | POA: Diagnosis not present

## 2021-08-19 DIAGNOSIS — K200 Eosinophilic esophagitis: Secondary | ICD-10-CM | POA: Diagnosis not present

## 2021-08-23 NOTE — Progress Notes (Addendum)
? ?Cardiology Office Note ? ? ?Date:  08/24/2021  ? ?ID:  [name], DOB [birth year], MRN [id number] ? ?PCP:  Burnard Bunting, MD  ?Cardiologist:   [name], MD  ? ?Pt presents for follow up of palpitations and for PFO ?  ?History of Present Illness: ?[name] is a 51 y.o. female with a history of palpitations, PFO.  Norrmal coronary arteries on CT scan in 2016.    I saw the pt in 2020 The pt went on to see Ezzie Dural to review PFO   After review of studies he recomm low dose ASA    ? ?Since seen she says she is doing good   Denies palpitations .  She thinks she had them in the past when she was stressed ?She is running  ? ? ?Current Meds  ?Medication Sig  ? aspirin EC 81 MG tablet Take 81 mg by mouth daily.  ? Azelastine HCl 137 MCG/SPRAY SOLN Place 1 spray into both nostrils 2 (two) times daily.  ? Biotin 10 MG CAPS Take 10 mg by mouth daily.   ? Calcium Carb-Cholecalciferol (CALCIUM 600/VITAMIN D3 PO) Take 1 tablet by mouth daily.  ? cetirizine (ZYRTEC) 10 MG tablet Take 10 mg by mouth daily.  ? Cholecalciferol (VITAMIN D3) 25 MCG (1000 UT) CAPS Take 1,000 Units by mouth daily.  ? loratadine (CLARITIN) 10 MG tablet Take 10 mg by mouth daily.  ? Multiple Vitamin (MULTIVITAMIN WITH MINERALS) TABS Take 1 tablet by mouth daily.  ? Omega-3 Fatty Acids (FISH OIL PO) Take 4 capsules by mouth daily.  ? omeprazole (PRILOSEC) 20 MG capsule Take 20 mg by mouth daily.  ? Probiotic Product (PROBIOTIC PO) Take 1 capsule by mouth daily.  ? ? ? ?Allergies:   Pollen extract  ? ?Past Medical History:  ?Diagnosis Date  ? Allergy   ? pollen  ? Barrett's syndrome   ? Gastric polyp   ? Heart murmur   ? Hiatal hernia   ? HNP (herniated nucleus pulposus with myelopathy), thoracic   ? Lumbar disc disease   ? Lumbar spondylosis   ? PFO (patent foramen ovale)   ? ? ?Past Surgical History:  ?Procedure Laterality Date  ? ACD infusion  12/08/2020  ? BUBBLE STUDY  11/01/2018  ? Procedure: BUBBLE STUDY;  Surgeon: Dorothy Spark, MD;  Location: [facility];  Service: Cardiovascular;;  ? CESAREAN SECTION  2007, 2009  ? x 2  ? COLONOSCOPY    ? HEMILAMINOTOMY LUMBAR SPINE  09/2003  ? KNEE ARTHROSCOPY Right   ? MICRODISCECTOMY LUMBAR    ? L5-S1  ? TEE WITHOUT CARDIOVERSION N/A 11/01/2018  ? Procedure: TRANSESOPHAGEAL ECHOCARDIOGRAM (TEE);  Surgeon: Dorothy Spark, MD;  Location: [facility];  Service: Cardiovascular;  Laterality: N/A;  ? TUBAL LIGATION    ? UPPER GASTROINTESTINAL ENDOSCOPY    ? ? ? ?Social History:  The patient  reports that she has never smoked. She has never used smokeless tobacco. She reports current alcohol use of about 1.0 standard drink per week. She reports that she does not use drugs.  ? ?Family History:  The patient's family history includes Heart disease in her father; Hypertension in her father; Leukemia in her mother.  ? ? ?ROS:  Please see the history of present illness. All other systems are reviewed and  Negative to the above problem except as noted.  ? ? ?PHYSICAL EXAM: ?VS:  BP 128/74   Pulse 63   Ht 5'  3.5" (1.613 m)   Wt 135 lb (61.2 kg)   BMI 23.54 kg/m?   ?GEN: Well nourished, well developed, ?HEENT: normal  ?Neck: no JVD, carotid bruits ?Cardiac: RRR; no murmurs  No LE  edema  ?Respiratory:  clear to auscultation bilaterally, ?GI: soft, nontender, nondistended, + BS  No hepatomegaly  ?MS: no deformity Moving all extremities   ?Skin: warm and dry, no rash ?Neuro:  Strength and sensation are intact ?Psych: euthymic mood, full affect ? ? ?EKG:  EKG is ordered today.  SR 63 bpm   Incomp RBBB    ? ? ? ?Cardiac Event Monitor: ?Study Highlights ?  ?Sinus rhythm, rates 40 to 175 bpm    ?No arrhythmias detected   ?  ?  ?Limited Echo with bubble 06/08/2017: ?Study Conclusions ?  ?- Left ventricle: The cavity size was normal. Systolic function was ?  normal. The estimated ejection fraction was in the range of 60% ?  to 65%. ?- Right ventricle: The cavity size was normal. Systolic function ?  was normal. ?-  Atrial septum: Strongly positive bubble study, suspect PFO. ?- Inferior vena cava: The vessel was normal in size. The ?  respirophasic diameter changes were in the normal range (>= 50%), ?  consistent with normal central venous pressure. ?  ?Impressions: ?  ?- Limited echo for PFO. ?  ?TEE: ?IMPRESSIONS ?  ?  ? 1. The left ventricle has normal systolic function, with an ejection fraction of 60-65%. The cavity size was normal. ? 2. The right ventricle has normal systolc function. The cavity was normal. There is no increase in right ventricular wall thickness. ? 3. Aortic valve regurgitation was not assessed by color flow Doppler. ? 4. Pulmonic valve regurgitation was not assessed by color flow Doppler. ? 5. Evidence of atrial level shunting detected by color flow Doppler. ? 6. There is redundancy of the interatrial septum. ?  ?SUMMARY ?  ?There is hypermobile interatrial septum, no aneurysm. There is no ?obvious left to right flow but strongly positive bubble study ?consistent with PFO. No ASD is seen. ?Eustachian valve is present. ? FINDINGS ? Left Ventricle: The left ventricle has normal systolic function, with an ejection fraction of 60-65%. The cavity size was normal. There is no increase in left ventricular wall thickness. ?  ?Right Ventricle: The right ventricle has normal systolic function. The cavity was normal. There is no increase in right ventricular wall thickness. ?  ?Left Atrium: Left atrial size was normal in size. ?  ?  ?Right Atrium: Right atrial size was normal in size. Right atrial pressure is estimated at 10 mmHg. ?  ?Interatrial Septum: Evidence of atrial level shunting detected by color flow Doppler. Agitated saline contrast was given intravenously to evaluate for intracardiac shunting. A There is redundancy of the interatrial septum is seen. ?  ?Pericardium: There is no evidence of pericardial effusion. ?  ?Mitral Valve: Mitral valve regurgitation is mild by color flow Doppler. ?  ?Tricuspid  Valve: Tricuspid valve regurgitation is trivial by color flow Doppler. ?  ?Aortic Valve: Aortic valve regurgitation was not assessed by color flow Doppler. ?  ?Pulmonic Valve: Pulmonic valve regurgitation was not assessed by color flow Doppler. ?  ?Venous: The inferior vena cava is normal in size with greater than 50% respiratory variability. ?  ?CT coronary angiogram   2//15/19 ? ?IMPRESSION: ?1. Coronary calcium score of 0. This was 0 percentile for age and ?sex matched control. ?  ?2. Normal coronary origin  with right dominance. ?  ?3. No evidence of CAD. ?  ? ?Lipid Panel ?No results found for: CHOL, TRIG, HDL, CHOLHDL, VLDL, LDLCALC, LDLDIRECT ?  ? ?Wt Readings from Last 3 Encounters:  ?08/24/21 135 lb (61.2 kg)  ?02/04/21 140 lb 4 oz (63.6 kg)  ?07/13/19 133 lb 12.8 oz (60.7 kg)  ?  ? ? ?ASSESSMENT AND PLAN: ? ?  PFO  Pt with large PFO.   Has been on 81 mg EcASA     No events   Will review literature for efficacy for stroke prevention    REepat echo to reeval R sided chambers ? ?2  Palpitations    Pt denies  ? ?3   HCM    ? ? ?Current medicines are reviewed at length with the patient today.  The patient does not have concerns regarding medicines. ? ?Signed, ?[name], MD  ?08/24/2021 8:15 AM    ?[facility] ?[address], [place], [place]  [address] ?Phone: [phone]; Fax: [phone]  ? ? ?

## 2021-08-24 ENCOUNTER — Encounter: Payer: Self-pay | Admitting: Internal Medicine

## 2021-08-24 ENCOUNTER — Ambulatory Visit: Payer: BC Managed Care – PPO | Admitting: Internal Medicine

## 2021-08-24 VITALS — BP 128/74 | HR 63 | Ht 63.5 in | Wt 135.0 lb

## 2021-08-24 DIAGNOSIS — Q2112 Patent foramen ovale: Secondary | ICD-10-CM | POA: Diagnosis not present

## 2021-08-24 NOTE — Patient Instructions (Signed)
Medication Instructions:  ?Your physician recommends that you continue on your current medications as directed. Please refer to the Current Medication list given to you today. ? ?*If you need a refill on your cardiac medications before your next appointment, please call your pharmacy* ? ? ?Lab Work: ?none ?If you have labs (blood work) drawn today and your tests are completely normal, you will receive your results only by: ?MyChart Message (if you have MyChart) OR ?A paper copy in the mail ?If you have any lab test that is abnormal or we need to change your treatment, we will call you to review the results. ? ? ?Testing/Procedures: ?Your physician has requested that you have an echocardiogram. Echocardiography is a painless test that uses sound waves to create images of your heart. It provides your doctor with information about the size and shape of your heart and how well your heart?s chambers and valves are working. This procedure takes approximately one hour. There are no restrictions for this procedure. ? ? ? ?Follow-Up: ?At [facility], you and your health needs are our priority.  As part of our continuing mission to provide you with exceptional heart care, we have created designated Provider Care Teams.  These Care Teams include your primary Cardiologist (physician) and Advanced Practice Providers (APPs -  Physician Assistants and Nurse Practitioners) who all work together to provide you with the care you need, when you need it. ? ?We recommend signing up for the patient portal called "MyChart".  Sign up information is provided on this After Visit Summary.  MyChart is used to connect with patients for Virtual Visits (Telemedicine).  Patients are able to view lab/test results, encounter notes, upcoming appointments, etc.  Non-urgent messages can be sent to your provider as well.   ?To learn more about what you can do with MyChart, go to [email or web address].   ? ? ?Important Information About  Sugar ? ? ? ? ?  ?

## 2021-08-25 ENCOUNTER — Other Ambulatory Visit: Payer: Self-pay | Admitting: Obstetrics and Gynecology

## 2021-08-25 DIAGNOSIS — Z1231 Encounter for screening mammogram for malignant neoplasm of breast: Secondary | ICD-10-CM

## 2021-09-02 DIAGNOSIS — H40033 Anatomical narrow angle, bilateral: Secondary | ICD-10-CM | POA: Diagnosis not present

## 2021-09-02 DIAGNOSIS — H524 Presbyopia: Secondary | ICD-10-CM | POA: Diagnosis not present

## 2021-09-02 DIAGNOSIS — J3081 Allergic rhinitis due to animal (cat) (dog) hair and dander: Secondary | ICD-10-CM | POA: Diagnosis not present

## 2021-09-02 DIAGNOSIS — J301 Allergic rhinitis due to pollen: Secondary | ICD-10-CM | POA: Diagnosis not present

## 2021-09-02 DIAGNOSIS — H40013 Open angle with borderline findings, low risk, bilateral: Secondary | ICD-10-CM | POA: Diagnosis not present

## 2021-09-02 DIAGNOSIS — J3089 Other allergic rhinitis: Secondary | ICD-10-CM | POA: Diagnosis not present

## 2021-09-02 DIAGNOSIS — D23122 Other benign neoplasm of skin of left lower eyelid, including canthus: Secondary | ICD-10-CM | POA: Diagnosis not present

## 2021-09-16 ENCOUNTER — Ambulatory Visit (HOSPITAL_COMMUNITY): Payer: BC Managed Care – PPO | Attending: Cardiology

## 2021-09-16 DIAGNOSIS — Q2112 Patent foramen ovale: Secondary | ICD-10-CM | POA: Diagnosis not present

## 2021-09-16 DIAGNOSIS — J3081 Allergic rhinitis due to animal (cat) (dog) hair and dander: Secondary | ICD-10-CM | POA: Diagnosis not present

## 2021-09-16 DIAGNOSIS — J301 Allergic rhinitis due to pollen: Secondary | ICD-10-CM | POA: Diagnosis not present

## 2021-09-16 DIAGNOSIS — J3089 Other allergic rhinitis: Secondary | ICD-10-CM | POA: Diagnosis not present

## 2021-09-16 LAB — ECHOCARDIOGRAM COMPLETE
Area-P 1/2: 3.08 cm2
S' Lateral: 2.6 cm

## 2021-09-17 DIAGNOSIS — J301 Allergic rhinitis due to pollen: Secondary | ICD-10-CM | POA: Diagnosis not present

## 2021-09-17 DIAGNOSIS — J3081 Allergic rhinitis due to animal (cat) (dog) hair and dander: Secondary | ICD-10-CM | POA: Diagnosis not present

## 2021-09-30 DIAGNOSIS — J3089 Other allergic rhinitis: Secondary | ICD-10-CM | POA: Diagnosis not present

## 2021-09-30 DIAGNOSIS — J3081 Allergic rhinitis due to animal (cat) (dog) hair and dander: Secondary | ICD-10-CM | POA: Diagnosis not present

## 2021-09-30 DIAGNOSIS — J301 Allergic rhinitis due to pollen: Secondary | ICD-10-CM | POA: Diagnosis not present

## 2021-10-09 ENCOUNTER — Ambulatory Visit
Admission: RE | Admit: 2021-10-09 | Discharge: 2021-10-09 | Disposition: A | Discharge status: Home / Self Care | Payer: BC Managed Care – PPO | Source: Ambulatory Visit | Attending: Obstetrics and Gynecology | Admitting: Obstetrics and Gynecology

## 2021-10-09 DIAGNOSIS — Z1231 Encounter for screening mammogram for malignant neoplasm of breast: Secondary | ICD-10-CM | POA: Diagnosis not present

## 2021-10-14 DIAGNOSIS — J3081 Allergic rhinitis due to animal (cat) (dog) hair and dander: Secondary | ICD-10-CM | POA: Diagnosis not present

## 2021-10-14 DIAGNOSIS — J301 Allergic rhinitis due to pollen: Secondary | ICD-10-CM | POA: Diagnosis not present

## 2021-10-14 DIAGNOSIS — J3089 Other allergic rhinitis: Secondary | ICD-10-CM | POA: Diagnosis not present

## 2021-10-15 DIAGNOSIS — J3089 Other allergic rhinitis: Secondary | ICD-10-CM | POA: Diagnosis not present

## 2021-10-26 DIAGNOSIS — J3081 Allergic rhinitis due to animal (cat) (dog) hair and dander: Secondary | ICD-10-CM | POA: Diagnosis not present

## 2021-10-26 DIAGNOSIS — J301 Allergic rhinitis due to pollen: Secondary | ICD-10-CM | POA: Diagnosis not present

## 2021-10-26 DIAGNOSIS — J3089 Other allergic rhinitis: Secondary | ICD-10-CM | POA: Diagnosis not present

## 2021-11-04 DIAGNOSIS — J3089 Other allergic rhinitis: Secondary | ICD-10-CM | POA: Diagnosis not present

## 2021-11-04 DIAGNOSIS — J301 Allergic rhinitis due to pollen: Secondary | ICD-10-CM | POA: Diagnosis not present

## 2021-11-04 DIAGNOSIS — J3081 Allergic rhinitis due to animal (cat) (dog) hair and dander: Secondary | ICD-10-CM | POA: Diagnosis not present

## 2021-11-20 DIAGNOSIS — J3081 Allergic rhinitis due to animal (cat) (dog) hair and dander: Secondary | ICD-10-CM | POA: Diagnosis not present

## 2021-11-20 DIAGNOSIS — J3089 Other allergic rhinitis: Secondary | ICD-10-CM | POA: Diagnosis not present

## 2021-11-20 DIAGNOSIS — J301 Allergic rhinitis due to pollen: Secondary | ICD-10-CM | POA: Diagnosis not present

## 2021-11-23 DIAGNOSIS — J301 Allergic rhinitis due to pollen: Secondary | ICD-10-CM | POA: Diagnosis not present

## 2021-11-23 DIAGNOSIS — J3089 Other allergic rhinitis: Secondary | ICD-10-CM | POA: Diagnosis not present

## 2021-11-23 DIAGNOSIS — J3081 Allergic rhinitis due to animal (cat) (dog) hair and dander: Secondary | ICD-10-CM | POA: Diagnosis not present

## 2021-11-30 DIAGNOSIS — J3089 Other allergic rhinitis: Secondary | ICD-10-CM | POA: Diagnosis not present

## 2021-11-30 DIAGNOSIS — J301 Allergic rhinitis due to pollen: Secondary | ICD-10-CM | POA: Diagnosis not present

## 2021-11-30 DIAGNOSIS — J3081 Allergic rhinitis due to animal (cat) (dog) hair and dander: Secondary | ICD-10-CM | POA: Diagnosis not present

## 2021-12-07 DIAGNOSIS — J3081 Allergic rhinitis due to animal (cat) (dog) hair and dander: Secondary | ICD-10-CM | POA: Diagnosis not present

## 2021-12-07 DIAGNOSIS — J3089 Other allergic rhinitis: Secondary | ICD-10-CM | POA: Diagnosis not present

## 2021-12-07 DIAGNOSIS — J301 Allergic rhinitis due to pollen: Secondary | ICD-10-CM | POA: Diagnosis not present

## 2021-12-14 DIAGNOSIS — J301 Allergic rhinitis due to pollen: Secondary | ICD-10-CM | POA: Diagnosis not present

## 2021-12-14 DIAGNOSIS — J3089 Other allergic rhinitis: Secondary | ICD-10-CM | POA: Diagnosis not present

## 2021-12-14 DIAGNOSIS — J3081 Allergic rhinitis due to animal (cat) (dog) hair and dander: Secondary | ICD-10-CM | POA: Diagnosis not present

## 2021-12-23 DIAGNOSIS — J301 Allergic rhinitis due to pollen: Secondary | ICD-10-CM | POA: Diagnosis not present

## 2021-12-23 DIAGNOSIS — J3081 Allergic rhinitis due to animal (cat) (dog) hair and dander: Secondary | ICD-10-CM | POA: Diagnosis not present

## 2021-12-23 DIAGNOSIS — J3089 Other allergic rhinitis: Secondary | ICD-10-CM | POA: Diagnosis not present

## 2022-01-06 DIAGNOSIS — J3089 Other allergic rhinitis: Secondary | ICD-10-CM | POA: Diagnosis not present

## 2022-01-06 DIAGNOSIS — J301 Allergic rhinitis due to pollen: Secondary | ICD-10-CM | POA: Diagnosis not present

## 2022-01-06 DIAGNOSIS — J3081 Allergic rhinitis due to animal (cat) (dog) hair and dander: Secondary | ICD-10-CM | POA: Diagnosis not present

## 2022-01-06 DIAGNOSIS — Z01419 Encounter for gynecological examination (general) (routine) without abnormal findings: Secondary | ICD-10-CM | POA: Diagnosis not present

## 2022-01-18 DIAGNOSIS — J3081 Allergic rhinitis due to animal (cat) (dog) hair and dander: Secondary | ICD-10-CM | POA: Diagnosis not present

## 2022-01-18 DIAGNOSIS — J3089 Other allergic rhinitis: Secondary | ICD-10-CM | POA: Diagnosis not present

## 2022-01-18 DIAGNOSIS — J301 Allergic rhinitis due to pollen: Secondary | ICD-10-CM | POA: Diagnosis not present

## 2022-01-20 DIAGNOSIS — Z0000 Encounter for general adult medical examination without abnormal findings: Secondary | ICD-10-CM | POA: Diagnosis not present

## 2022-01-20 DIAGNOSIS — R82998 Other abnormal findings in urine: Secondary | ICD-10-CM | POA: Diagnosis not present

## 2022-01-20 DIAGNOSIS — Z1212 Encounter for screening for malignant neoplasm of rectum: Secondary | ICD-10-CM | POA: Diagnosis not present

## 2022-01-20 DIAGNOSIS — Z79899 Other long term (current) drug therapy: Secondary | ICD-10-CM | POA: Diagnosis not present

## 2022-01-26 DIAGNOSIS — Z1212 Encounter for screening for malignant neoplasm of rectum: Secondary | ICD-10-CM | POA: Diagnosis not present

## 2022-01-27 DIAGNOSIS — Z23 Encounter for immunization: Secondary | ICD-10-CM | POA: Diagnosis not present

## 2022-01-27 DIAGNOSIS — Z1331 Encounter for screening for depression: Secondary | ICD-10-CM | POA: Diagnosis not present

## 2022-01-27 DIAGNOSIS — Z0000 Encounter for general adult medical examination without abnormal findings: Secondary | ICD-10-CM | POA: Diagnosis not present

## 2022-01-27 DIAGNOSIS — Z1339 Encounter for screening examination for other mental health and behavioral disorders: Secondary | ICD-10-CM | POA: Diagnosis not present

## 2022-01-27 DIAGNOSIS — R7301 Impaired fasting glucose: Secondary | ICD-10-CM | POA: Diagnosis not present

## 2022-02-01 DIAGNOSIS — J301 Allergic rhinitis due to pollen: Secondary | ICD-10-CM | POA: Diagnosis not present

## 2022-02-01 DIAGNOSIS — J3081 Allergic rhinitis due to animal (cat) (dog) hair and dander: Secondary | ICD-10-CM | POA: Diagnosis not present

## 2022-02-01 DIAGNOSIS — J3089 Other allergic rhinitis: Secondary | ICD-10-CM | POA: Diagnosis not present

## 2022-02-15 DIAGNOSIS — J3081 Allergic rhinitis due to animal (cat) (dog) hair and dander: Secondary | ICD-10-CM | POA: Diagnosis not present

## 2022-02-15 DIAGNOSIS — J301 Allergic rhinitis due to pollen: Secondary | ICD-10-CM | POA: Diagnosis not present

## 2022-02-15 DIAGNOSIS — J3089 Other allergic rhinitis: Secondary | ICD-10-CM | POA: Diagnosis not present

## 2022-03-01 DIAGNOSIS — J301 Allergic rhinitis due to pollen: Secondary | ICD-10-CM | POA: Diagnosis not present

## 2022-03-01 DIAGNOSIS — J3089 Other allergic rhinitis: Secondary | ICD-10-CM | POA: Diagnosis not present

## 2022-03-01 DIAGNOSIS — J3081 Allergic rhinitis due to animal (cat) (dog) hair and dander: Secondary | ICD-10-CM | POA: Diagnosis not present

## 2022-03-02 DIAGNOSIS — L821 Other seborrheic keratosis: Secondary | ICD-10-CM | POA: Diagnosis not present

## 2022-03-02 DIAGNOSIS — D1801 Hemangioma of skin and subcutaneous tissue: Secondary | ICD-10-CM | POA: Diagnosis not present

## 2022-03-02 DIAGNOSIS — L603 Nail dystrophy: Secondary | ICD-10-CM | POA: Diagnosis not present

## 2022-03-15 DIAGNOSIS — J3081 Allergic rhinitis due to animal (cat) (dog) hair and dander: Secondary | ICD-10-CM | POA: Diagnosis not present

## 2022-03-15 DIAGNOSIS — J3089 Other allergic rhinitis: Secondary | ICD-10-CM | POA: Diagnosis not present

## 2022-03-15 DIAGNOSIS — J301 Allergic rhinitis due to pollen: Secondary | ICD-10-CM | POA: Diagnosis not present

## 2022-03-29 DIAGNOSIS — J301 Allergic rhinitis due to pollen: Secondary | ICD-10-CM | POA: Diagnosis not present

## 2022-03-29 DIAGNOSIS — J3081 Allergic rhinitis due to animal (cat) (dog) hair and dander: Secondary | ICD-10-CM | POA: Diagnosis not present

## 2022-03-29 DIAGNOSIS — J3089 Other allergic rhinitis: Secondary | ICD-10-CM | POA: Diagnosis not present

## 2022-03-30 ENCOUNTER — Encounter: Payer: Self-pay | Admitting: Gastroenterology

## 2022-04-06 ENCOUNTER — Telehealth: Payer: Self-pay | Admitting: *Deleted

## 2022-04-06 NOTE — Telephone Encounter (Signed)
Loma Sousa,  This pt has a PFO and is scheduled with Dr. Fuller Plan on 05/11/22.  Thanks,  Osvaldo Angst

## 2022-04-09 ENCOUNTER — Ambulatory Visit (AMBULATORY_SURGERY_CENTER): Payer: BC Managed Care – PPO

## 2022-04-09 VITALS — Ht 63.0 in | Wt 138.0 lb

## 2022-04-09 DIAGNOSIS — Z1159 Encounter for screening for other viral diseases: Secondary | ICD-10-CM

## 2022-04-09 DIAGNOSIS — K2270 Barrett's esophagus without dysplasia: Secondary | ICD-10-CM

## 2022-04-09 NOTE — Progress Notes (Signed)
No egg or soy allergy known to patient  No issues known to pt with past sedation with any surgeries or procedures Patient denies ever being told they had issues or difficulty with intubation  No FH of Malignant Hyperthermia Pt is not on diet pills Pt is not on  home 02  Pt is not on blood thinners  Pt denies issues with constipation  No A fib or A flutter Have any cardiac testing pending--no Pt instructed to use [email or web address] or GoodRx for a price reduction on prep  Patient's chart reviewed by [name] CNRA prior to previsit and patient appropriate for the [facility].  Previsit completed and red dot placed by patient's name on their procedure day (on provider's schedule).    

## 2022-04-12 DIAGNOSIS — J3089 Other allergic rhinitis: Secondary | ICD-10-CM | POA: Diagnosis not present

## 2022-04-12 DIAGNOSIS — J3081 Allergic rhinitis due to animal (cat) (dog) hair and dander: Secondary | ICD-10-CM | POA: Diagnosis not present

## 2022-04-12 DIAGNOSIS — J301 Allergic rhinitis due to pollen: Secondary | ICD-10-CM | POA: Diagnosis not present

## 2022-04-22 DIAGNOSIS — J301 Allergic rhinitis due to pollen: Secondary | ICD-10-CM | POA: Diagnosis not present

## 2022-04-22 DIAGNOSIS — J3081 Allergic rhinitis due to animal (cat) (dog) hair and dander: Secondary | ICD-10-CM | POA: Diagnosis not present

## 2022-04-22 DIAGNOSIS — J3089 Other allergic rhinitis: Secondary | ICD-10-CM | POA: Diagnosis not present

## 2022-05-04 DIAGNOSIS — J3089 Other allergic rhinitis: Secondary | ICD-10-CM | POA: Diagnosis not present

## 2022-05-04 DIAGNOSIS — J301 Allergic rhinitis due to pollen: Secondary | ICD-10-CM | POA: Diagnosis not present

## 2022-05-04 DIAGNOSIS — J3081 Allergic rhinitis due to animal (cat) (dog) hair and dander: Secondary | ICD-10-CM | POA: Diagnosis not present

## 2022-05-05 DIAGNOSIS — D7589 Other specified diseases of blood and blood-forming organs: Secondary | ICD-10-CM | POA: Diagnosis not present

## 2022-05-11 ENCOUNTER — Encounter: Payer: BC Managed Care – PPO | Admitting: Gastroenterology

## 2022-05-13 ENCOUNTER — Encounter: Payer: Self-pay | Admitting: Gastroenterology

## 2022-05-17 DIAGNOSIS — J3081 Allergic rhinitis due to animal (cat) (dog) hair and dander: Secondary | ICD-10-CM | POA: Diagnosis not present

## 2022-05-17 DIAGNOSIS — J3089 Other allergic rhinitis: Secondary | ICD-10-CM | POA: Diagnosis not present

## 2022-05-17 DIAGNOSIS — J301 Allergic rhinitis due to pollen: Secondary | ICD-10-CM | POA: Diagnosis not present

## 2022-05-18 ENCOUNTER — Encounter: Payer: Self-pay | Admitting: Gastroenterology

## 2022-05-18 ENCOUNTER — Ambulatory Visit (AMBULATORY_SURGERY_CENTER): Payer: BC Managed Care – PPO | Admitting: Gastroenterology

## 2022-05-18 VITALS — BP 98/59 | HR 64 | Temp 97.8°F | Resp 11 | Ht 63.0 in | Wt 138.0 lb

## 2022-05-18 DIAGNOSIS — K2270 Barrett's esophagus without dysplasia: Secondary | ICD-10-CM | POA: Diagnosis not present

## 2022-05-18 DIAGNOSIS — K449 Diaphragmatic hernia without obstruction or gangrene: Secondary | ICD-10-CM

## 2022-05-18 DIAGNOSIS — K219 Gastro-esophageal reflux disease without esophagitis: Secondary | ICD-10-CM | POA: Diagnosis not present

## 2022-05-18 DIAGNOSIS — K2100 Gastro-esophageal reflux disease with esophagitis, without bleeding: Secondary | ICD-10-CM

## 2022-05-18 MED ORDER — SODIUM CHLORIDE 0.9 % IV SOLN: 500.0000 mL | Freq: Once | INTRAVENOUS | Status: DC

## 2022-05-18 NOTE — Progress Notes (Signed)
Sedate, gd SR, tolerated procedure well, VSS, report to RN 

## 2022-05-18 NOTE — Progress Notes (Signed)
History & Physical  Primary Care Physician:  Burnard Bunting, MD Primary Gastroenterologist: Lucio Edward, MD  CHIEF COMPLAINT:  Barrett's esophagus   HPI: [name] is a 52 y.o. female with short segment Barrett's esophagus without dysplasia for surveillance EGD.   Past Medical History:  Diagnosis Date   Allergy    pollen   Barrett's syndrome    Gastric polyp    GERD (gastroesophageal reflux disease)    Heart murmur    Hiatal hernia    HNP (herniated nucleus pulposus with myelopathy), thoracic    Lumbar disc disease    Lumbar spondylosis    PFO (patent foramen ovale)     Past Surgical History:  Procedure Laterality Date   ANTERIOR CERVICAL DECOMP/DISCECTOMY FUSION  12/2020   BUBBLE STUDY  11/01/2018   Procedure: BUBBLE STUDY;  Surgeon: Dorothy Spark, MD;  Location: [facility] ENDOSCOPY;  Service: Cardiovascular;;   CESAREAN SECTION  2007, 2009   x 2   COLONOSCOPY     HEMILAMINOTOMY LUMBAR SPINE  09/2003   KNEE ARTHROSCOPY Right    MICRODISCECTOMY LUMBAR     L5-S1   TEE WITHOUT CARDIOVERSION N/A 11/01/2018   Procedure: TRANSESOPHAGEAL ECHOCARDIOGRAM (TEE);  Surgeon: Dorothy Spark, MD;  Location: [facility] ENDOSCOPY;  Service: Cardiovascular;  Laterality: N/A;   TUBAL LIGATION     UPPER GASTROINTESTINAL ENDOSCOPY      Prior to Admission medications   Medication Sig Start Date End Date Taking? Authorizing Provider  aspirin EC 81 MG tablet Take 81 mg by mouth daily.   Yes [provider]  Biotin 10 MG CAPS Take 10 mg by mouth daily.    Yes [provider]  cetirizine (ZYRTEC) 10 MG tablet Take 10 mg by mouth daily.   Yes [provider]  Cholecalciferol (VITAMIN D3) 25 MCG (1000 UT) CAPS Take 1,000 Units by mouth daily.   Yes [provider]  Glucosamine 500 MG CAPS Glucosamine   Yes [provider]  Multiple Vitamin (MULTIVITAMIN WITH MINERALS) TABS Take 1 tablet by mouth daily.   Yes [provider]   Omega-3 Fatty Acids (FISH OIL PO) Take 4 capsules by mouth daily.   Yes [provider]  omeprazole (PRILOSEC) 20 MG capsule Take 20 mg by mouth daily.   Yes [provider]  Probiotic Product (PROBIOTIC PO) Take 1 capsule by mouth daily.   Yes [provider]  Azelastine HCl 137 MCG/SPRAY SOLN Place 1 spray into both nostrils 2 (two) times daily. 06/12/21   [provider]  Calcium Carb-Cholecalciferol (CALCIUM 600/VITAMIN D3 PO) Take 1 tablet by mouth daily.    [provider]  loratadine (CLARITIN) 10 MG tablet Take 10 mg by mouth daily.    [provider]    Current Outpatient Medications  Medication Sig Dispense Refill   aspirin EC 81 MG tablet Take 81 mg by mouth daily.     Biotin 10 MG CAPS Take 10 mg by mouth daily.      cetirizine (ZYRTEC) 10 MG tablet Take 10 mg by mouth daily.     Cholecalciferol (VITAMIN D3) 25 MCG (1000 UT) CAPS Take 1,000 Units by mouth daily.     Glucosamine 500 MG CAPS Glucosamine     Multiple Vitamin (MULTIVITAMIN WITH MINERALS) TABS Take 1 tablet by mouth daily.     Omega-3 Fatty Acids (FISH OIL PO) Take 4 capsules by mouth daily.     omeprazole (PRILOSEC) 20 MG capsule Take 20 mg by  mouth daily.     Probiotic Product (PROBIOTIC PO) Take 1 capsule by mouth daily.     Azelastine HCl 137 MCG/SPRAY SOLN Place 1 spray into both nostrils 2 (two) times daily.     Calcium Carb-Cholecalciferol (CALCIUM 600/VITAMIN D3 PO) Take 1 tablet by mouth daily.     loratadine (CLARITIN) 10 MG tablet Take 10 mg by mouth daily.     Current Facility-Administered Medications  Medication Dose Route Frequency Provider Last Rate Last Admin   0.9 %  sodium chloride infusion  500 mL Intravenous Once Ladene Artist, MD        Allergies as of 05/18/2022 - Review Complete 05/18/2022  Allergen Reaction Noted   Pollen extract  07/17/2018    Family History  Problem Relation Age of Onset   Leukemia Mother    Hypertension  Father    Heart disease Father    Colon cancer Neg Hx    Colon polyps Neg Hx    Esophageal cancer Neg Hx    Stomach cancer Neg Hx    Rectal cancer Neg Hx    Breast cancer Neg Hx     Social History   Socioeconomic History   Marital status: Widowed    Spouse name: Not on file   Number of children: 2   Years of education: Not on file   Highest education level: Not on file  Occupational History   Occupation: veternarian    Employer: [employer]  Tobacco Use   Smoking status: Never   Smokeless tobacco: Never  Vaping Use   Vaping Use: Never used  Substance and Sexual Activity   Alcohol use: Yes    Alcohol/week: 1.0 standard drink of alcohol    Types: 1 Glasses of wine per week    Comment: 1-2 per year   Drug use: No   Sexual activity: Not Currently    Partners: Male    Birth control/protection: None  Other Topics Concern   Not on file  Social History Narrative   Not on file   Social Determinants of Health   Financial Resource Strain: Not on file  Food Insecurity: Not on file  Transportation Needs: Not on file  Physical Activity: Not on file  Stress: Not on file  Social Connections: Not on file  Intimate Partner Violence: Not on file    Review of Systems:  All systems reviewed were negative except where noted in HPI.   Physical Exam: General:  Alert, well-developed, in NAD Head:  Normocephalic and atraumatic. Eyes:  Sclera clear, no icterus.   Conjunctiva pink. Ears:  Normal auditory acuity. Mouth:  No deformity or lesions.  Neck:  Supple; no masses . Lungs:  Clear throughout to auscultation.   No wheezes, crackles, or rhonchi. No acute distress. Heart:  Regular rate and rhythm; no murmurs. Abdomen:  Soft, nondistended, nontender. No masses, hepatomegaly. No obvious masses.  Normal bowel .    Rectal:  Deferred   Msk:  Symmetrical without gross deformities.. Pulses:  Normal pulses noted. Extremities:  Without edema. Neurologic:  Alert and  oriented x4;   grossly normal neurologically. Skin:  Intact without significant lesions or rashes. Psych:  Alert and cooperative. Normal mood and affect.  Impression / Plan:   Short segment Barrett's esophagus without dysplasia for surveillance EGD.  Pricilla Riffle. Fuller Plan  05/18/2022, 2:33 PM See Shea Evans, [facility], to contact our on call provider

## 2022-05-18 NOTE — Progress Notes (Signed)
Called to room to assist during endoscopic procedure.  Patient ID and intended procedure confirmed with present staff. Received instructions for my participation in the procedure from the performing physician.

## 2022-05-18 NOTE — Patient Instructions (Addendum)
Await pathology results from the biopsies taken today.  Resume previous diet and medications.  Increase Omeprazole to 40 mg by mouth each day.  Repeat upper endoscopy in 3-5 years, pending pathology review for surveillance of Barrett's esophagus.    YOU HAD AN ENDOSCOPIC PROCEDURE TODAY AT [facility]:   Refer to the procedure report that was given to you for any specific questions about what was found during the examination.  If the procedure report does not answer your questions, please call your gastroenterologist to clarify.  If you requested that your care partner not be given the details of your procedure findings, then the procedure report has been included in a sealed envelope for you to review at your convenience later.  YOU SHOULD EXPECT: Some feelings of bloating in the abdomen. Passage of more gas than usual.  Walking can help get rid of the air that was put into your GI tract during the procedure and reduce the bloating. If you had a lower endoscopy (such as a colonoscopy or flexible sigmoidoscopy) you may notice spotting of blood in your stool or on the toilet paper. If you underwent a bowel prep for your procedure, you may not have a normal bowel movement for a few days.  Please Note:  You might notice some irritation and congestion in your nose or some drainage.  This is from the oxygen used during your procedure.  There is no need for concern and it should clear up in a day or so.  SYMPTOMS TO REPORT IMMEDIATELY:   Following upper endoscopy (EGD)  Vomiting of blood or coffee ground material  New chest pain or pain under the shoulder blades  Painful or persistently difficult swallowing  New shortness of breath  Fever of 100F or higher  Black, tarry-looking stools  For urgent or emergent issues, a gastroenterologist can be reached at any hour by calling [phone]. Do not use MyChart messaging for urgent concerns.    DIET:  We do recommend a small  meal at first, but then you may proceed to your regular diet.  Drink plenty of fluids but you should avoid alcoholic beverages for 24 hours.  ACTIVITY:  You should plan to take it easy for the rest of today and you should NOT DRIVE or use heavy machinery until tomorrow (because of the sedation medicines used during the test).    FOLLOW UP: Our staff will call the number listed on your records the next business day following your procedure.  We will call around 7:15- 8:00 am to check on you and address any questions or concerns that you may have regarding the information given to you following your procedure. If we do not reach you, we will leave a message.     If any biopsies were taken you will be contacted by phone or by letter within the next 1-3 weeks.  Please call us at [phone] if you have not heard about the biopsies in 3 weeks.    SIGNATURES/CONFIDENTIALITY: You and/or your care partner have signed paperwork which will be entered into your electronic medical record.  These signatures attest to the fact that that the information above on your After Visit Summary has been reviewed and is understood.  Full responsibility of the confidentiality of this discharge information lies with you and/or your care-partner.

## 2022-05-18 NOTE — Op Note (Addendum)
[facility] Patient Name: [name] Procedure Date: 05/18/2022 2:28 PM MRN: [id number] Endoscopist: Ladene Artist , MD, [id number] Age: 52 Referring MD:  Date of Birth: 14-Nov-1970 Gender: Female Account #: [id number] Procedure:                Upper GI endoscopy Indications:              Surveillance for malignancy due to personal history                            of Barrett's esophagus Medicines:                Monitored Anesthesia Care Procedure:                Pre-Anesthesia Assessment:                           - Prior to the procedure, a History and Physical                            was performed, and patient medications and                            allergies were reviewed. The patient's tolerance of                            previous anesthesia was also reviewed. The risks                            and benefits of the procedure and the sedation                            options and risks were discussed with the patient.                            All questions were answered, and informed consent                            was obtained. Prior Anticoagulants: The patient has                            taken no anticoagulant or antiplatelet agents. ASA                            Grade Assessment: II - A patient with mild systemic                            disease. After reviewing the risks and benefits,                            the patient was deemed in satisfactory condition to                            undergo the procedure.  After obtaining informed consent, the endoscope was                            passed under direct vision. Throughout the                            procedure, the patient's blood pressure, pulse, and                            oxygen saturations were monitored continuously. The                            GIF [id number] #[id number] was introduced through the                            mouth, and advanced to the  second part of duodenum.                            The upper GI endoscopy was accomplished without                            difficulty. The patient tolerated the procedure                            well. Scope In: Scope Out: Findings:                 LA Grade B (one or more mucosal breaks greater than                            5 mm, not extending between the tops of two mucosal                            folds) esophagitis with no bleeding was found at                            the gastroesophageal junction.                           There were esophageal mucosal changes secondary to                            established short-segment Barrett's disease present                            in the distal esophagus. The maximum longitudinal                            extent of these mucosal changes was 1 cm in length.                            Mucosa was biopsied with a cold forceps for  histology in a targeted manner at intervals of 0.5                            cm in the lower third of the esophagus. One                            specimen bottle was sent to pathology.                           The exam of the esophagus was otherwise normal.                           A small hiatal hernia was present.                           The exam of the stomach was otherwise normal.                           The duodenal bulb and second portion of the                            duodenum were normal. Complications:            No immediate complications. Estimated Blood Loss:     Estimated blood loss was minimal. Impression:               - LA Grade B reflux esophagitis with no bleeding.                           - Esophageal mucosal changes secondary to                            established short-segment Barrett's disease.                            Biopsied.                           - Small hiatal hernia.                           - Normal duodenal bulb and second  portion of the                            duodenum. Recommendation:           - Patient has a contact number available for                            emergencies. The signs and symptoms of potential                            delayed complications were discussed with the                            patient. Return to normal activities tomorrow.  Written discharge instructions were provided to the                            patient.                           - Resume previous diet.                           - Follow antireflux measures long term.                           - Continue present medications.                           - Increase omeprazole to 40 mg po qd.                           - Await pathology results.                           - Repeat upper endoscopy in 3-5 years, pending                            pathology review, for surveillance of Barrett's                            esophagus. Ladene Artist, MD 05/18/2022 3:01:13 PM This report has been signed electronically.

## 2022-05-19 ENCOUNTER — Telehealth: Payer: Self-pay | Admitting: *Deleted

## 2022-05-19 NOTE — Telephone Encounter (Signed)
  Follow up Call-     05/18/2022    1:47 PM  Call back number  Post procedure Call Back phone  # [phone]  Permission to leave phone message Yes     Patient questions:  Do you have a fever, pain , or abdominal swelling? No. Pain Score  0 *  Have you tolerated food without any problems? Yes.    Have you been able to return to your normal activities? Yes.    Do you have any questions about your discharge instructions: Diet   No. Medications  No. Follow up visit  No.  Do you have questions or concerns about your Care? No.  Actions: * If pain score is 4 or above: No action needed, pain <4.

## 2022-05-27 ENCOUNTER — Encounter: Payer: Self-pay | Admitting: Gastroenterology

## 2022-05-31 DIAGNOSIS — J301 Allergic rhinitis due to pollen: Secondary | ICD-10-CM | POA: Diagnosis not present

## 2022-05-31 DIAGNOSIS — J3089 Other allergic rhinitis: Secondary | ICD-10-CM | POA: Diagnosis not present

## 2022-05-31 DIAGNOSIS — J3081 Allergic rhinitis due to animal (cat) (dog) hair and dander: Secondary | ICD-10-CM | POA: Diagnosis not present

## 2022-06-14 DIAGNOSIS — J3081 Allergic rhinitis due to animal (cat) (dog) hair and dander: Secondary | ICD-10-CM | POA: Diagnosis not present

## 2022-06-14 DIAGNOSIS — J301 Allergic rhinitis due to pollen: Secondary | ICD-10-CM | POA: Diagnosis not present

## 2022-06-14 DIAGNOSIS — J3089 Other allergic rhinitis: Secondary | ICD-10-CM | POA: Diagnosis not present

## 2022-06-28 DIAGNOSIS — J3081 Allergic rhinitis due to animal (cat) (dog) hair and dander: Secondary | ICD-10-CM | POA: Diagnosis not present

## 2022-06-28 DIAGNOSIS — J3089 Other allergic rhinitis: Secondary | ICD-10-CM | POA: Diagnosis not present

## 2022-06-28 DIAGNOSIS — J301 Allergic rhinitis due to pollen: Secondary | ICD-10-CM | POA: Diagnosis not present

## 2022-06-29 DIAGNOSIS — J3081 Allergic rhinitis due to animal (cat) (dog) hair and dander: Secondary | ICD-10-CM | POA: Diagnosis not present

## 2022-06-29 DIAGNOSIS — J301 Allergic rhinitis due to pollen: Secondary | ICD-10-CM | POA: Diagnosis not present

## 2022-06-30 DIAGNOSIS — J3089 Other allergic rhinitis: Secondary | ICD-10-CM | POA: Diagnosis not present

## 2022-07-13 DIAGNOSIS — J3089 Other allergic rhinitis: Secondary | ICD-10-CM | POA: Diagnosis not present

## 2022-07-13 DIAGNOSIS — J3081 Allergic rhinitis due to animal (cat) (dog) hair and dander: Secondary | ICD-10-CM | POA: Diagnosis not present

## 2022-07-13 DIAGNOSIS — J301 Allergic rhinitis due to pollen: Secondary | ICD-10-CM | POA: Diagnosis not present

## 2022-07-26 DIAGNOSIS — J3081 Allergic rhinitis due to animal (cat) (dog) hair and dander: Secondary | ICD-10-CM | POA: Diagnosis not present

## 2022-07-26 DIAGNOSIS — J3089 Other allergic rhinitis: Secondary | ICD-10-CM | POA: Diagnosis not present

## 2022-07-26 DIAGNOSIS — J301 Allergic rhinitis due to pollen: Secondary | ICD-10-CM | POA: Diagnosis not present

## 2022-08-02 DIAGNOSIS — J3081 Allergic rhinitis due to animal (cat) (dog) hair and dander: Secondary | ICD-10-CM | POA: Diagnosis not present

## 2022-08-02 DIAGNOSIS — J3089 Other allergic rhinitis: Secondary | ICD-10-CM | POA: Diagnosis not present

## 2022-08-02 DIAGNOSIS — J301 Allergic rhinitis due to pollen: Secondary | ICD-10-CM | POA: Diagnosis not present

## 2022-08-09 DIAGNOSIS — J3089 Other allergic rhinitis: Secondary | ICD-10-CM | POA: Diagnosis not present

## 2022-08-09 DIAGNOSIS — J301 Allergic rhinitis due to pollen: Secondary | ICD-10-CM | POA: Diagnosis not present

## 2022-08-09 DIAGNOSIS — J3081 Allergic rhinitis due to animal (cat) (dog) hair and dander: Secondary | ICD-10-CM | POA: Diagnosis not present

## 2022-08-23 DIAGNOSIS — J3089 Other allergic rhinitis: Secondary | ICD-10-CM | POA: Diagnosis not present

## 2022-08-23 DIAGNOSIS — J301 Allergic rhinitis due to pollen: Secondary | ICD-10-CM | POA: Diagnosis not present

## 2022-08-23 DIAGNOSIS — J3081 Allergic rhinitis due to animal (cat) (dog) hair and dander: Secondary | ICD-10-CM | POA: Diagnosis not present

## 2022-08-26 ENCOUNTER — Other Ambulatory Visit: Payer: Self-pay | Admitting: Obstetrics and Gynecology

## 2022-08-26 DIAGNOSIS — Z1231 Encounter for screening mammogram for malignant neoplasm of breast: Secondary | ICD-10-CM

## 2022-09-06 DIAGNOSIS — J301 Allergic rhinitis due to pollen: Secondary | ICD-10-CM | POA: Diagnosis not present

## 2022-09-06 DIAGNOSIS — J3089 Other allergic rhinitis: Secondary | ICD-10-CM | POA: Diagnosis not present

## 2022-09-06 DIAGNOSIS — J3081 Allergic rhinitis due to animal (cat) (dog) hair and dander: Secondary | ICD-10-CM | POA: Diagnosis not present

## 2022-09-20 DIAGNOSIS — J3089 Other allergic rhinitis: Secondary | ICD-10-CM | POA: Diagnosis not present

## 2022-09-20 DIAGNOSIS — J3081 Allergic rhinitis due to animal (cat) (dog) hair and dander: Secondary | ICD-10-CM | POA: Diagnosis not present

## 2022-09-20 DIAGNOSIS — J301 Allergic rhinitis due to pollen: Secondary | ICD-10-CM | POA: Diagnosis not present

## 2022-10-04 DIAGNOSIS — J3081 Allergic rhinitis due to animal (cat) (dog) hair and dander: Secondary | ICD-10-CM | POA: Diagnosis not present

## 2022-10-04 DIAGNOSIS — J301 Allergic rhinitis due to pollen: Secondary | ICD-10-CM | POA: Diagnosis not present

## 2022-10-04 DIAGNOSIS — J3089 Other allergic rhinitis: Secondary | ICD-10-CM | POA: Diagnosis not present

## 2022-10-12 ENCOUNTER — Ambulatory Visit: Payer: BC Managed Care – PPO

## 2022-10-13 DIAGNOSIS — J3081 Allergic rhinitis due to animal (cat) (dog) hair and dander: Secondary | ICD-10-CM | POA: Diagnosis not present

## 2022-10-13 DIAGNOSIS — R052 Subacute cough: Secondary | ICD-10-CM | POA: Diagnosis not present

## 2022-10-13 DIAGNOSIS — J301 Allergic rhinitis due to pollen: Secondary | ICD-10-CM | POA: Diagnosis not present

## 2022-10-13 DIAGNOSIS — K200 Eosinophilic esophagitis: Secondary | ICD-10-CM | POA: Diagnosis not present

## 2022-10-13 DIAGNOSIS — J3089 Other allergic rhinitis: Secondary | ICD-10-CM | POA: Diagnosis not present

## 2022-10-15 ENCOUNTER — Ambulatory Visit
Admission: RE | Admit: 2022-10-15 | Discharge: 2022-10-15 | Disposition: A | Discharge status: Home / Self Care | Payer: BC Managed Care – PPO | Source: Ambulatory Visit | Attending: Obstetrics and Gynecology | Admitting: Obstetrics and Gynecology

## 2022-10-15 DIAGNOSIS — Z1231 Encounter for screening mammogram for malignant neoplasm of breast: Secondary | ICD-10-CM | POA: Diagnosis not present

## 2022-10-20 DIAGNOSIS — J3089 Other allergic rhinitis: Secondary | ICD-10-CM | POA: Diagnosis not present

## 2022-10-20 DIAGNOSIS — J301 Allergic rhinitis due to pollen: Secondary | ICD-10-CM | POA: Diagnosis not present

## 2022-10-20 DIAGNOSIS — J3081 Allergic rhinitis due to animal (cat) (dog) hair and dander: Secondary | ICD-10-CM | POA: Diagnosis not present

## 2022-10-25 DIAGNOSIS — J301 Allergic rhinitis due to pollen: Secondary | ICD-10-CM | POA: Diagnosis not present

## 2022-10-25 DIAGNOSIS — J3089 Other allergic rhinitis: Secondary | ICD-10-CM | POA: Diagnosis not present

## 2022-10-25 DIAGNOSIS — J3081 Allergic rhinitis due to animal (cat) (dog) hair and dander: Secondary | ICD-10-CM | POA: Diagnosis not present

## 2022-11-01 DIAGNOSIS — J3089 Other allergic rhinitis: Secondary | ICD-10-CM | POA: Diagnosis not present

## 2022-11-01 DIAGNOSIS — J301 Allergic rhinitis due to pollen: Secondary | ICD-10-CM | POA: Diagnosis not present

## 2022-11-01 DIAGNOSIS — J3081 Allergic rhinitis due to animal (cat) (dog) hair and dander: Secondary | ICD-10-CM | POA: Diagnosis not present

## 2022-11-03 DIAGNOSIS — S83242A Other tear of medial meniscus, current injury, left knee, initial encounter: Secondary | ICD-10-CM | POA: Diagnosis not present

## 2022-11-03 DIAGNOSIS — M1712 Unilateral primary osteoarthritis, left knee: Secondary | ICD-10-CM | POA: Diagnosis not present

## 2022-11-19 DIAGNOSIS — J301 Allergic rhinitis due to pollen: Secondary | ICD-10-CM | POA: Diagnosis not present

## 2022-11-19 DIAGNOSIS — J3081 Allergic rhinitis due to animal (cat) (dog) hair and dander: Secondary | ICD-10-CM | POA: Diagnosis not present

## 2022-11-19 DIAGNOSIS — J3089 Other allergic rhinitis: Secondary | ICD-10-CM | POA: Diagnosis not present

## 2022-12-06 DIAGNOSIS — J3089 Other allergic rhinitis: Secondary | ICD-10-CM | POA: Diagnosis not present

## 2022-12-06 DIAGNOSIS — J301 Allergic rhinitis due to pollen: Secondary | ICD-10-CM | POA: Diagnosis not present

## 2022-12-06 DIAGNOSIS — J3081 Allergic rhinitis due to animal (cat) (dog) hair and dander: Secondary | ICD-10-CM | POA: Diagnosis not present

## 2022-12-07 DIAGNOSIS — M1712 Unilateral primary osteoarthritis, left knee: Secondary | ICD-10-CM | POA: Diagnosis not present

## 2022-12-07 DIAGNOSIS — M25851 Other specified joint disorders, right hip: Secondary | ICD-10-CM | POA: Diagnosis not present

## 2022-12-14 DIAGNOSIS — M1712 Unilateral primary osteoarthritis, left knee: Secondary | ICD-10-CM | POA: Diagnosis not present

## 2022-12-14 DIAGNOSIS — S73191A Other sprain of right hip, initial encounter: Secondary | ICD-10-CM | POA: Diagnosis not present

## 2022-12-21 DIAGNOSIS — M170 Bilateral primary osteoarthritis of knee: Secondary | ICD-10-CM | POA: Diagnosis not present

## 2022-12-27 DIAGNOSIS — J3089 Other allergic rhinitis: Secondary | ICD-10-CM | POA: Diagnosis not present

## 2022-12-27 DIAGNOSIS — J3081 Allergic rhinitis due to animal (cat) (dog) hair and dander: Secondary | ICD-10-CM | POA: Diagnosis not present

## 2022-12-27 DIAGNOSIS — J301 Allergic rhinitis due to pollen: Secondary | ICD-10-CM | POA: Diagnosis not present

## 2023-01-05 DIAGNOSIS — M25551 Pain in right hip: Secondary | ICD-10-CM | POA: Diagnosis not present

## 2023-01-12 DIAGNOSIS — Z01419 Encounter for gynecological examination (general) (routine) without abnormal findings: Secondary | ICD-10-CM | POA: Diagnosis not present

## 2023-01-12 DIAGNOSIS — N952 Postmenopausal atrophic vaginitis: Secondary | ICD-10-CM | POA: Diagnosis not present

## 2023-01-14 DIAGNOSIS — J301 Allergic rhinitis due to pollen: Secondary | ICD-10-CM | POA: Diagnosis not present

## 2023-01-14 DIAGNOSIS — J3089 Other allergic rhinitis: Secondary | ICD-10-CM | POA: Diagnosis not present

## 2023-01-14 DIAGNOSIS — J3081 Allergic rhinitis due to animal (cat) (dog) hair and dander: Secondary | ICD-10-CM | POA: Diagnosis not present

## 2023-02-01 DIAGNOSIS — J301 Allergic rhinitis due to pollen: Secondary | ICD-10-CM | POA: Diagnosis not present

## 2023-02-01 DIAGNOSIS — J3089 Other allergic rhinitis: Secondary | ICD-10-CM | POA: Diagnosis not present

## 2023-02-01 DIAGNOSIS — J3081 Allergic rhinitis due to animal (cat) (dog) hair and dander: Secondary | ICD-10-CM | POA: Diagnosis not present

## 2023-02-01 DIAGNOSIS — M1711 Unilateral primary osteoarthritis, right knee: Secondary | ICD-10-CM | POA: Diagnosis not present

## 2023-02-01 DIAGNOSIS — Z23 Encounter for immunization: Secondary | ICD-10-CM | POA: Diagnosis not present

## 2023-02-02 DIAGNOSIS — Z0000 Encounter for general adult medical examination without abnormal findings: Secondary | ICD-10-CM | POA: Diagnosis not present

## 2023-02-02 DIAGNOSIS — R7301 Impaired fasting glucose: Secondary | ICD-10-CM | POA: Diagnosis not present

## 2023-02-02 DIAGNOSIS — Z1212 Encounter for screening for malignant neoplasm of rectum: Secondary | ICD-10-CM | POA: Diagnosis not present

## 2023-02-08 DIAGNOSIS — M1711 Unilateral primary osteoarthritis, right knee: Secondary | ICD-10-CM | POA: Diagnosis not present

## 2023-02-09 DIAGNOSIS — Z1212 Encounter for screening for malignant neoplasm of rectum: Secondary | ICD-10-CM | POA: Diagnosis not present

## 2023-02-09 DIAGNOSIS — Z0000 Encounter for general adult medical examination without abnormal findings: Secondary | ICD-10-CM | POA: Diagnosis not present

## 2023-02-09 DIAGNOSIS — Z1331 Encounter for screening for depression: Secondary | ICD-10-CM | POA: Diagnosis not present

## 2023-02-09 DIAGNOSIS — R82998 Other abnormal findings in urine: Secondary | ICD-10-CM | POA: Diagnosis not present

## 2023-02-15 DIAGNOSIS — M1711 Unilateral primary osteoarthritis, right knee: Secondary | ICD-10-CM | POA: Diagnosis not present

## 2023-02-21 DIAGNOSIS — J301 Allergic rhinitis due to pollen: Secondary | ICD-10-CM | POA: Diagnosis not present

## 2023-02-21 DIAGNOSIS — J3089 Other allergic rhinitis: Secondary | ICD-10-CM | POA: Diagnosis not present

## 2023-02-21 DIAGNOSIS — J3081 Allergic rhinitis due to animal (cat) (dog) hair and dander: Secondary | ICD-10-CM | POA: Diagnosis not present

## 2023-03-03 DIAGNOSIS — D2261 Melanocytic nevi of right upper limb, including shoulder: Secondary | ICD-10-CM | POA: Diagnosis not present

## 2023-03-03 DIAGNOSIS — L821 Other seborrheic keratosis: Secondary | ICD-10-CM | POA: Diagnosis not present

## 2023-03-03 DIAGNOSIS — D225 Melanocytic nevi of trunk: Secondary | ICD-10-CM | POA: Diagnosis not present

## 2023-03-03 DIAGNOSIS — D2262 Melanocytic nevi of left upper limb, including shoulder: Secondary | ICD-10-CM | POA: Diagnosis not present

## 2023-03-10 DIAGNOSIS — J3081 Allergic rhinitis due to animal (cat) (dog) hair and dander: Secondary | ICD-10-CM | POA: Diagnosis not present

## 2023-03-10 DIAGNOSIS — J301 Allergic rhinitis due to pollen: Secondary | ICD-10-CM | POA: Diagnosis not present

## 2023-03-10 DIAGNOSIS — J3089 Other allergic rhinitis: Secondary | ICD-10-CM | POA: Diagnosis not present

## 2023-03-28 DIAGNOSIS — J301 Allergic rhinitis due to pollen: Secondary | ICD-10-CM | POA: Diagnosis not present

## 2023-03-28 DIAGNOSIS — J3081 Allergic rhinitis due to animal (cat) (dog) hair and dander: Secondary | ICD-10-CM | POA: Diagnosis not present

## 2023-03-28 DIAGNOSIS — J3089 Other allergic rhinitis: Secondary | ICD-10-CM | POA: Diagnosis not present

## 2023-04-18 DIAGNOSIS — J3081 Allergic rhinitis due to animal (cat) (dog) hair and dander: Secondary | ICD-10-CM | POA: Diagnosis not present

## 2023-04-18 DIAGNOSIS — J301 Allergic rhinitis due to pollen: Secondary | ICD-10-CM | POA: Diagnosis not present

## 2023-04-18 DIAGNOSIS — J3089 Other allergic rhinitis: Secondary | ICD-10-CM | POA: Diagnosis not present

## 2023-05-02 DIAGNOSIS — J3081 Allergic rhinitis due to animal (cat) (dog) hair and dander: Secondary | ICD-10-CM | POA: Diagnosis not present

## 2023-05-02 DIAGNOSIS — J301 Allergic rhinitis due to pollen: Secondary | ICD-10-CM | POA: Diagnosis not present

## 2023-05-02 DIAGNOSIS — J3089 Other allergic rhinitis: Secondary | ICD-10-CM | POA: Diagnosis not present

## 2023-05-18 DIAGNOSIS — H0011 Chalazion right upper eyelid: Secondary | ICD-10-CM | POA: Diagnosis not present

## 2023-05-23 DIAGNOSIS — J3081 Allergic rhinitis due to animal (cat) (dog) hair and dander: Secondary | ICD-10-CM | POA: Diagnosis not present

## 2023-05-23 DIAGNOSIS — J301 Allergic rhinitis due to pollen: Secondary | ICD-10-CM | POA: Diagnosis not present

## 2023-05-23 DIAGNOSIS — J3089 Other allergic rhinitis: Secondary | ICD-10-CM | POA: Diagnosis not present

## 2023-06-06 DIAGNOSIS — J301 Allergic rhinitis due to pollen: Secondary | ICD-10-CM | POA: Diagnosis not present

## 2023-06-06 DIAGNOSIS — J3081 Allergic rhinitis due to animal (cat) (dog) hair and dander: Secondary | ICD-10-CM | POA: Diagnosis not present

## 2023-06-06 DIAGNOSIS — J3089 Other allergic rhinitis: Secondary | ICD-10-CM | POA: Diagnosis not present

## 2023-06-08 DIAGNOSIS — J301 Allergic rhinitis due to pollen: Secondary | ICD-10-CM | POA: Diagnosis not present

## 2023-06-08 DIAGNOSIS — J3081 Allergic rhinitis due to animal (cat) (dog) hair and dander: Secondary | ICD-10-CM | POA: Diagnosis not present

## 2023-06-08 DIAGNOSIS — H0011 Chalazion right upper eyelid: Secondary | ICD-10-CM | POA: Diagnosis not present

## 2023-06-09 DIAGNOSIS — J3089 Other allergic rhinitis: Secondary | ICD-10-CM | POA: Diagnosis not present

## 2023-06-13 ENCOUNTER — Encounter: Payer: Self-pay | Admitting: Podiatry

## 2023-06-13 ENCOUNTER — Ambulatory Visit: Payer: BC Managed Care – PPO | Admitting: Podiatry

## 2023-06-13 DIAGNOSIS — L600 Ingrowing nail: Secondary | ICD-10-CM | POA: Diagnosis not present

## 2023-06-13 NOTE — Patient Instructions (Signed)

## 2023-06-15 NOTE — Progress Notes (Signed)
Subjective:   Patient ID: [name], female   DOB: 53 y.o.   MRN: [id number]   HPI Patient presents stating that she has had chronic problems with the second nailbed left and she is a runner and is always losing this nail and it is becoming more of a bother for her as time goes on.  Patient does not smoke and is very active   Review of Systems  All other systems reviewed and are negative.       Objective:  Physical Exam Vitals and nursing note reviewed.  Constitutional:      Appearance: She is well-developed.  Pulmonary:     Effort: Pulmonary effort is normal.  Musculoskeletal:        General: Normal range of motion.  Skin:    General: Skin is warm.  Neurological:     Mental Status: She is alert.     Neurovascular status intact muscle strength was found to be adequate range of motion adequate with damaged discolored second nailbed left that does get painful and she has lost it numerous times over the years and is an avid runner and does run marathons.  Good digital perfusion well-oriented     Assessment:  Managed left second nailbed secondary to trauma that is becoming less manageable and more discomforting     Plan:  H&P reviewed the difference between trauma and fungus and this is certainly trauma as the biggest component.  She wants something done to eliminate the problem and I have recommended permanent nail removal and I explained procedure risk and patient wants surgery.  At this point I anesthetized the left second toe 60 mg like Marcaine mixture after she read then signed consent form and using sterile instrumentation I removed the nail exposed matrix applied phenol for applications 30 seconds followed by alcohol lavage sterile dressing gave instructions on soaks and wear dressing 24 hours taken earlier if throbbing were to occur and encouraged her to call with questions concerns which may arise

## 2023-06-20 DIAGNOSIS — J3089 Other allergic rhinitis: Secondary | ICD-10-CM | POA: Diagnosis not present

## 2023-06-20 DIAGNOSIS — J301 Allergic rhinitis due to pollen: Secondary | ICD-10-CM | POA: Diagnosis not present

## 2023-06-20 DIAGNOSIS — J3081 Allergic rhinitis due to animal (cat) (dog) hair and dander: Secondary | ICD-10-CM | POA: Diagnosis not present

## 2023-07-11 DIAGNOSIS — J3089 Other allergic rhinitis: Secondary | ICD-10-CM | POA: Diagnosis not present

## 2023-07-11 DIAGNOSIS — J3081 Allergic rhinitis due to animal (cat) (dog) hair and dander: Secondary | ICD-10-CM | POA: Diagnosis not present

## 2023-07-11 DIAGNOSIS — J301 Allergic rhinitis due to pollen: Secondary | ICD-10-CM | POA: Diagnosis not present

## 2023-07-18 DIAGNOSIS — J301 Allergic rhinitis due to pollen: Secondary | ICD-10-CM | POA: Diagnosis not present

## 2023-07-18 DIAGNOSIS — J3081 Allergic rhinitis due to animal (cat) (dog) hair and dander: Secondary | ICD-10-CM | POA: Diagnosis not present

## 2023-07-18 DIAGNOSIS — J3089 Other allergic rhinitis: Secondary | ICD-10-CM | POA: Diagnosis not present

## 2023-07-25 DIAGNOSIS — J301 Allergic rhinitis due to pollen: Secondary | ICD-10-CM | POA: Diagnosis not present

## 2023-07-25 DIAGNOSIS — J3081 Allergic rhinitis due to animal (cat) (dog) hair and dander: Secondary | ICD-10-CM | POA: Diagnosis not present

## 2023-07-25 DIAGNOSIS — J3089 Other allergic rhinitis: Secondary | ICD-10-CM | POA: Diagnosis not present

## 2023-08-08 DIAGNOSIS — J3089 Other allergic rhinitis: Secondary | ICD-10-CM | POA: Diagnosis not present

## 2023-08-08 DIAGNOSIS — J301 Allergic rhinitis due to pollen: Secondary | ICD-10-CM | POA: Diagnosis not present

## 2023-08-08 DIAGNOSIS — J3081 Allergic rhinitis due to animal (cat) (dog) hair and dander: Secondary | ICD-10-CM | POA: Diagnosis not present

## 2023-08-22 DIAGNOSIS — J3089 Other allergic rhinitis: Secondary | ICD-10-CM | POA: Diagnosis not present

## 2023-08-22 DIAGNOSIS — J301 Allergic rhinitis due to pollen: Secondary | ICD-10-CM | POA: Diagnosis not present

## 2023-08-22 DIAGNOSIS — J3081 Allergic rhinitis due to animal (cat) (dog) hair and dander: Secondary | ICD-10-CM | POA: Diagnosis not present

## 2023-09-12 DIAGNOSIS — J301 Allergic rhinitis due to pollen: Secondary | ICD-10-CM | POA: Diagnosis not present

## 2023-09-12 DIAGNOSIS — J3089 Other allergic rhinitis: Secondary | ICD-10-CM | POA: Diagnosis not present

## 2023-09-12 DIAGNOSIS — J3081 Allergic rhinitis due to animal (cat) (dog) hair and dander: Secondary | ICD-10-CM | POA: Diagnosis not present

## 2023-09-13 ENCOUNTER — Other Ambulatory Visit: Payer: Self-pay | Admitting: Obstetrics and Gynecology

## 2023-09-13 DIAGNOSIS — Z1231 Encounter for screening mammogram for malignant neoplasm of breast: Secondary | ICD-10-CM

## 2023-10-03 DIAGNOSIS — J3081 Allergic rhinitis due to animal (cat) (dog) hair and dander: Secondary | ICD-10-CM | POA: Diagnosis not present

## 2023-10-03 DIAGNOSIS — J3089 Other allergic rhinitis: Secondary | ICD-10-CM | POA: Diagnosis not present

## 2023-10-03 DIAGNOSIS — J301 Allergic rhinitis due to pollen: Secondary | ICD-10-CM | POA: Diagnosis not present

## 2023-10-12 DIAGNOSIS — J301 Allergic rhinitis due to pollen: Secondary | ICD-10-CM | POA: Diagnosis not present

## 2023-10-12 DIAGNOSIS — M1711 Unilateral primary osteoarthritis, right knee: Secondary | ICD-10-CM | POA: Diagnosis not present

## 2023-10-12 DIAGNOSIS — K200 Eosinophilic esophagitis: Secondary | ICD-10-CM | POA: Diagnosis not present

## 2023-10-12 DIAGNOSIS — J3089 Other allergic rhinitis: Secondary | ICD-10-CM | POA: Diagnosis not present

## 2023-10-12 DIAGNOSIS — J3081 Allergic rhinitis due to animal (cat) (dog) hair and dander: Secondary | ICD-10-CM | POA: Diagnosis not present

## 2023-10-17 ENCOUNTER — Ambulatory Visit
Admission: RE | Admit: 2023-10-17 | Discharge: 2023-10-17 | Disposition: A | Discharge status: Home / Self Care | Source: Ambulatory Visit

## 2023-10-17 DIAGNOSIS — Z1231 Encounter for screening mammogram for malignant neoplasm of breast: Secondary | ICD-10-CM

## 2023-10-17 DIAGNOSIS — J3081 Allergic rhinitis due to animal (cat) (dog) hair and dander: Secondary | ICD-10-CM | POA: Diagnosis not present

## 2023-10-17 DIAGNOSIS — J301 Allergic rhinitis due to pollen: Secondary | ICD-10-CM | POA: Diagnosis not present

## 2023-10-17 DIAGNOSIS — J3089 Other allergic rhinitis: Secondary | ICD-10-CM | POA: Diagnosis not present

## 2023-10-24 DIAGNOSIS — J3089 Other allergic rhinitis: Secondary | ICD-10-CM | POA: Diagnosis not present

## 2023-10-24 DIAGNOSIS — J3081 Allergic rhinitis due to animal (cat) (dog) hair and dander: Secondary | ICD-10-CM | POA: Diagnosis not present

## 2023-10-24 DIAGNOSIS — J301 Allergic rhinitis due to pollen: Secondary | ICD-10-CM | POA: Diagnosis not present

## 2023-11-09 ENCOUNTER — Ambulatory Visit (HOSPITAL_COMMUNITY)
Admission: RE | Admit: 2023-11-09 | Discharge: 2023-11-09 | Disposition: A | Discharge status: Home / Self Care | Source: Ambulatory Visit | Attending: Vascular Surgery | Admitting: Vascular Surgery

## 2023-11-09 ENCOUNTER — Other Ambulatory Visit (HOSPITAL_COMMUNITY): Payer: Self-pay | Admitting: Internal Medicine

## 2023-11-09 DIAGNOSIS — M79604 Pain in right leg: Secondary | ICD-10-CM

## 2023-11-14 DIAGNOSIS — J3089 Other allergic rhinitis: Secondary | ICD-10-CM | POA: Diagnosis not present

## 2023-11-14 DIAGNOSIS — J301 Allergic rhinitis due to pollen: Secondary | ICD-10-CM | POA: Diagnosis not present

## 2023-11-14 DIAGNOSIS — J3081 Allergic rhinitis due to animal (cat) (dog) hair and dander: Secondary | ICD-10-CM | POA: Diagnosis not present

## 2023-11-16 DIAGNOSIS — S83241A Other tear of medial meniscus, current injury, right knee, initial encounter: Secondary | ICD-10-CM | POA: Diagnosis not present

## 2023-11-16 DIAGNOSIS — M1711 Unilateral primary osteoarthritis, right knee: Secondary | ICD-10-CM | POA: Diagnosis not present

## 2023-11-18 DIAGNOSIS — M25561 Pain in right knee: Secondary | ICD-10-CM | POA: Diagnosis not present

## 2023-12-07 DIAGNOSIS — M1711 Unilateral primary osteoarthritis, right knee: Secondary | ICD-10-CM | POA: Diagnosis not present

## 2023-12-12 DIAGNOSIS — J301 Allergic rhinitis due to pollen: Secondary | ICD-10-CM | POA: Diagnosis not present

## 2023-12-12 DIAGNOSIS — J3081 Allergic rhinitis due to animal (cat) (dog) hair and dander: Secondary | ICD-10-CM | POA: Diagnosis not present

## 2023-12-12 DIAGNOSIS — J3089 Other allergic rhinitis: Secondary | ICD-10-CM | POA: Diagnosis not present

## 2023-12-14 DIAGNOSIS — M1711 Unilateral primary osteoarthritis, right knee: Secondary | ICD-10-CM | POA: Diagnosis not present

## 2023-12-20 ENCOUNTER — Emergency Department (HOSPITAL_BASED_OUTPATIENT_CLINIC_OR_DEPARTMENT_OTHER)
Admission: EM | Admit: 2023-12-20 | Discharge: 2023-12-20 | Disposition: A | Discharge status: Home / Self Care | Payer: Worker's Compensation | Attending: Emergency Medicine | Admitting: Emergency Medicine

## 2023-12-20 ENCOUNTER — Other Ambulatory Visit: Payer: Self-pay

## 2023-12-20 DIAGNOSIS — Z7982 Long term (current) use of aspirin: Secondary | ICD-10-CM | POA: Insufficient documentation

## 2023-12-20 DIAGNOSIS — W540XXA Bitten by dog, initial encounter: Secondary | ICD-10-CM | POA: Diagnosis not present

## 2023-12-20 DIAGNOSIS — S0993XA Unspecified injury of face, initial encounter: Secondary | ICD-10-CM | POA: Diagnosis present

## 2023-12-20 DIAGNOSIS — S0181XA Laceration without foreign body of other part of head, initial encounter: Secondary | ICD-10-CM | POA: Insufficient documentation

## 2023-12-20 MED ORDER — LIDOCAINE HCL (PF) 1 % IJ SOLN
30.0000 mL | Freq: Once | INTRAMUSCULAR | Status: DC
  Filled 2023-12-20 (×2): qty 30

## 2023-12-20 NOTE — ED Triage Notes (Signed)
 Patient states was at work restraining a large dog who jumped up and hit her chin. Small Laceration to chin.

## 2023-12-20 NOTE — ED Provider Notes (Signed)
 [place] EMERGENCY DEPARTMENT AT [facility] Provider Note   CSN: [id number] Arrival date & time: 12/20/23  1021     Patient presents with: Laceration   [name] is a 53 y.o. female who presents to the ED today with a small 1 cm laceration to the mental prominence.  She is a International aid/development worker, had a dog that forcefully had butted her chin incidentally, she denies any loss of consciousness or any other head injury, has had small laceration as previously noted which is controlled bleeding with direct pressure.    Laceration      Prior to Admission medications   Medication Sig Start Date End Date Taking? Authorizing Provider  aspirin EC 81 MG tablet Take 81 mg by mouth daily.    [provider]  Azelastine HCl 137 MCG/SPRAY SOLN Place 1 spray into both nostrils 2 (two) times daily. 06/12/21   [provider]  Biotin 10 MG CAPS Take 10 mg by mouth daily.     [provider]  Calcium Carb-Cholecalciferol (CALCIUM 600/VITAMIN D3 PO) Take 1 tablet by mouth daily.    [provider]  cetirizine (ZYRTEC) 10 MG tablet Take 10 mg by mouth daily.    [provider]  Cholecalciferol (VITAMIN D3) 25 MCG (1000 UT) CAPS Take 1,000 Units by mouth daily.    [provider]  Glucosamine 500 MG CAPS Glucosamine    [provider]  loratadine (CLARITIN) 10 MG tablet Take 10 mg by mouth daily.    [provider]  Multiple Vitamin (MULTIVITAMIN WITH MINERALS) TABS Take 1 tablet by mouth daily.    [provider]  Omega-3 Fatty Acids (FISH OIL PO) Take 4 capsules by mouth daily.    [provider]  omeprazole (PRILOSEC) 20 MG capsule Take 20 mg by mouth daily.    [provider]  Probiotic Product (PROBIOTIC PO) Take 1 capsule by mouth daily.    [provider]    Allergies: Pollen extract    Review of Systems  Skin:  Positive for wound.    Updated Vital Signs BP 131/83 (BP  Location: Right Arm)   Pulse 63   Temp 98 F (36.7 C)   Resp 16   SpO2 97%   Physical Exam Vitals and nursing note reviewed.  Constitutional:      General: She is not in acute distress.    Appearance: She is well-developed.  HENT:     Head: Normocephalic and atraumatic.  Eyes:     Conjunctiva/sclera: Conjunctivae normal.  Cardiovascular:     Rate and Rhythm: Normal rate and regular rhythm.     Heart sounds: No murmur heard. Pulmonary:     Effort: Pulmonary effort is normal. No respiratory distress.     Breath sounds: Normal breath sounds.  Abdominal:     Palpations: Abdomen is soft.     Tenderness: There is no abdominal tenderness.  Musculoskeletal:        General: No swelling.     Cervical back: Neck supple.  Skin:    General: Skin is warm and dry.     Capillary Refill: Capillary refill takes less than 2 seconds.         Comments: Small 1 cm laceration noted to the mental prominence   Neurological:     Mental Status: She is alert.  Psychiatric:        Mood and Affect: Mood normal.     (all labs ordered are listed, but only  abnormal results are displayed) Labs Reviewed - No data to display  EKG: None  Radiology: No results found.   .Laceration Repair  Date/Time: 12/20/2023 12:39 PM  Performed by: Myriam Dorn BROCKS, PA Authorized by: Myriam Dorn BROCKS, PA   Consent:    Consent obtained:  Verbal   Consent given by:  Patient   Risks, benefits, and alternatives were discussed: yes     Risks discussed:  Infection, pain, poor cosmetic result, need for additional repair, poor wound healing and retained foreign body   Alternatives discussed:  No treatment, delayed treatment and observation Universal protocol:    Procedure explained and questions answered to patient or proxy's satisfaction: yes     Patient identity confirmed:  Verbally with patient, arm band and hospital-assigned identification number Anesthesia:    Anesthesia method:  Local infiltration    Local anesthetic:  Lidocaine  1% w/o epi Laceration details:    Location:  Face   Face location:  Chin   Length (cm):  1   Depth (mm):  1 Exploration:    Limited defect created (wound extended): no     Hemostasis achieved with:  Direct pressure   Wound exploration: entire depth of wound visualized     Contaminated: no   Treatment:    Area cleansed with:  Saline and chlorhexidine   Amount of cleaning:  Standard   Irrigation solution:  Sterile saline   Irrigation volume:  50   Irrigation method:  Syringe   Debridement:  None   Undermining:  None   Scar revision: no   Skin repair:    Repair method:  Sutures   Suture size:  5-0   Suture material:  Prolene   Suture technique:  Simple interrupted   Number of sutures:  2 Approximation:    Approximation:  Close Repair type:    Repair type:  Simple Post-procedure details:    Dressing:  Antibiotic ointment   Procedure completion:  Tolerated well, no immediate complications    Medications Ordered in the ED  lidocaine  (PF) (XYLOCAINE ) 1 % injection 30 mL (has no administration in time range)                                    Medical Decision Making Risk Prescription drug management.   After assessment of this patient, she had a simple laceration to the mental prominence, no high risk mechanism for fracture of the underlying bony structures, no instability or deformity noted with physical inspection.  As such, laceration is repaired as noted in the procedure note.  This was tolerated without incident, given extensive irrigation of the site, and low risk for infection, will defer wound prophylaxis at this time.  She can continue to use over-the-counter medications for pain control, follow-up with her primary care and 5 to 7 days for suture removal.  Careful return precautions been given regarding return if purulence or increasing erythema develop around the wound, otherwise advised to keep clean and dry and apply antibiotic ointment  as needed.     Final diagnoses:  Facial laceration, initial encounter    ED Discharge Orders     None          Myriam Dorn BROCKS, [place] 12/20/23 1242    Tegeler, Lonni PARAS, MD 12/20/23 1450

## 2023-12-21 DIAGNOSIS — M1711 Unilateral primary osteoarthritis, right knee: Secondary | ICD-10-CM | POA: Diagnosis not present

## 2024-01-09 DIAGNOSIS — J3089 Other allergic rhinitis: Secondary | ICD-10-CM | POA: Diagnosis not present

## 2024-01-09 DIAGNOSIS — J301 Allergic rhinitis due to pollen: Secondary | ICD-10-CM | POA: Diagnosis not present

## 2024-01-09 DIAGNOSIS — J3081 Allergic rhinitis due to animal (cat) (dog) hair and dander: Secondary | ICD-10-CM | POA: Diagnosis not present

## 2024-01-18 DIAGNOSIS — Z01419 Encounter for gynecological examination (general) (routine) without abnormal findings: Secondary | ICD-10-CM | POA: Diagnosis not present

## 2024-02-06 DIAGNOSIS — J3081 Allergic rhinitis due to animal (cat) (dog) hair and dander: Secondary | ICD-10-CM | POA: Diagnosis not present

## 2024-02-06 DIAGNOSIS — J301 Allergic rhinitis due to pollen: Secondary | ICD-10-CM | POA: Diagnosis not present

## 2024-02-06 DIAGNOSIS — J3089 Other allergic rhinitis: Secondary | ICD-10-CM | POA: Diagnosis not present

## 2024-02-08 DIAGNOSIS — Z1212 Encounter for screening for malignant neoplasm of rectum: Secondary | ICD-10-CM | POA: Diagnosis not present

## 2024-02-08 DIAGNOSIS — Z0000 Encounter for general adult medical examination without abnormal findings: Secondary | ICD-10-CM | POA: Diagnosis not present

## 2024-02-08 DIAGNOSIS — R7989 Other specified abnormal findings of blood chemistry: Secondary | ICD-10-CM | POA: Diagnosis not present

## 2024-02-08 DIAGNOSIS — R7301 Impaired fasting glucose: Secondary | ICD-10-CM | POA: Diagnosis not present

## 2024-02-08 DIAGNOSIS — Z0189 Encounter for other specified special examinations: Secondary | ICD-10-CM | POA: Diagnosis not present

## 2024-02-12 DIAGNOSIS — Z1212 Encounter for screening for malignant neoplasm of rectum: Secondary | ICD-10-CM | POA: Diagnosis not present

## 2024-02-15 DIAGNOSIS — Z0000 Encounter for general adult medical examination without abnormal findings: Secondary | ICD-10-CM | POA: Diagnosis not present

## 2024-02-15 DIAGNOSIS — Z1331 Encounter for screening for depression: Secondary | ICD-10-CM | POA: Diagnosis not present

## 2024-02-15 DIAGNOSIS — Z23 Encounter for immunization: Secondary | ICD-10-CM | POA: Diagnosis not present

## 2024-02-15 DIAGNOSIS — R82998 Other abnormal findings in urine: Secondary | ICD-10-CM | POA: Diagnosis not present

## 2024-03-05 DIAGNOSIS — J3081 Allergic rhinitis due to animal (cat) (dog) hair and dander: Secondary | ICD-10-CM | POA: Diagnosis not present

## 2024-03-05 DIAGNOSIS — J3089 Other allergic rhinitis: Secondary | ICD-10-CM | POA: Diagnosis not present

## 2024-03-05 DIAGNOSIS — J301 Allergic rhinitis due to pollen: Secondary | ICD-10-CM | POA: Diagnosis not present

## 2024-03-13 DIAGNOSIS — D225 Melanocytic nevi of trunk: Secondary | ICD-10-CM | POA: Diagnosis not present

## 2024-03-13 DIAGNOSIS — D1722 Benign lipomatous neoplasm of skin and subcutaneous tissue of left arm: Secondary | ICD-10-CM | POA: Diagnosis not present

## 2024-03-13 DIAGNOSIS — L821 Other seborrheic keratosis: Secondary | ICD-10-CM | POA: Diagnosis not present

## 2024-03-21 DIAGNOSIS — S83241A Other tear of medial meniscus, current injury, right knee, initial encounter: Secondary | ICD-10-CM | POA: Diagnosis not present

## 2024-03-21 DIAGNOSIS — S83281A Other tear of lateral meniscus, current injury, right knee, initial encounter: Secondary | ICD-10-CM | POA: Diagnosis not present

## 2024-03-21 DIAGNOSIS — M8440XA Pathological fracture, unspecified site, initial encounter for fracture: Secondary | ICD-10-CM | POA: Diagnosis not present

## 2024-03-22 ENCOUNTER — Telehealth (HOSPITAL_BASED_OUTPATIENT_CLINIC_OR_DEPARTMENT_OTHER): Payer: Self-pay

## 2024-03-22 DIAGNOSIS — Q2112 Patent foramen ovale: Secondary | ICD-10-CM

## 2024-03-22 NOTE — Telephone Encounter (Signed)
   Name: [name]  DOB: 12/10/1970  MRN: [id number]  Primary Cardiologist: Vina Gull, MD  Chart reviewed as part of pre-operative protocol coverage. Because of [name]'s past medical history and time since last visit, she will require a follow-up in-office visit in order to better assess preoperative cardiovascular risk.  Pre-op covering staff: - Please schedule appointment and call patient to inform them. If patient already had an upcoming appointment within acceptable timeframe, please add pre-op clearance to the appointment notes so provider is aware. - Please contact requesting surgeon's office via preferred method (i.e, phone, fax) to inform them of need for appointment prior to surgery.  I will reach out to Dr. Gull regarding aspirin hold since patient has a large PFO.  Orren LOISE Fabry, PA-C  03/22/2024, 11:11 AM

## 2024-03-22 NOTE — Telephone Encounter (Signed)
   Pre-operative Risk Assessment    Patient Name: GLENOLA WHEAT  DOB: 1971/04/05 MRN: [id number]   Date of last office visit: 08/24/21 with Dr. Okey Date of next office visit: NA   Request for Surgical Clearance    Procedure:  Right knee scope PMM/PLM subchondroplasty  Date of Surgery:  Clearance 04/20/24                                 Surgeon:  Dr. Sharl Socks Group or Practice Name:  [facility] Phone number:  [phone] Fax number:  [phone] attn: Kirke Maze   Type of Clearance Requested:   - Medical  - Pharmacy:  Hold Aspirin 5 days prior   Type of Anesthesia:  Not Indicated   Additional requests/questions:    Bonney Augustin JONETTA Delores   03/22/2024, 10:34 AM

## 2024-03-22 NOTE — Telephone Encounter (Signed)
 Patient scheduled for pre-op clearance on 03/27/24 with Reche Finder, NP.

## 2024-03-26 NOTE — Progress Notes (Unsigned)
 Cardiology Office Note:    Date:  03/27/2024   ID:  [name], DOB 17-Apr-1971, MRN [id number]  PCP:  Shepard Ade, MD   [facility] Providers Cardiologist:  Vina Gull, MD     Referring MD: Shepard Ade, MD   Chief complaint: Preoperative cardiovascular clearance     History of Present Illness:   [name] is a 53 y.o. female with a hx of heart murmur, PFO, GERD presenting today for preoperative cardiovascular clearance for upcoming Right knee scope PMM/PLM subchondroplasty scheduled for 04/20/2024.  2019 echo showed possible PFO with interatrial septum hypermobility, bubble study confirmed a large PFO present.  Coronary CTA with a calcium score of 0, normal aorta, normal coronary arteries.  July 2020 TEE confirmed PFO.  Was referred to structural heart team and August 2020, seen by Dr. Wonda, who recommended that given her lack of neurological symptoms or history suggestive of platypea-orthodeoxia syndrome that PFO closure was not indicated at this time. Avoiding estrogen replacement therapy and continuing low-dose aspirin was recommended.  Last seen by cardiology on 08/24/2021, was doing well at that time, repeat echo was ordered, showing LVEF 60-65%, no RWMA, normal diastolic parameters, RV size mildly enlarged, mild mitral regurgitation, AV sclerosis present, no evidence of stenosis, RA pressure 8 mmHg.  Presents independently, doing well from a cardiovascular standpoint. She denies chest pain, palpitations, dyspnea, orthopnea, n, v, dark/tarry/bloody stools, hematuria, dizziness, syncope, edema, weight gain.  She is very physically active, works out 5 days a week, she walks, runs, and swims regularly. Has had to reduce her the amount she has been running secondary to her knee pain.  Works as a international aid/development worker.  ROS:   Please see the history of present illness.    All other systems reviewed and are negative.     Past Medical History:  Diagnosis Date    Allergy    pollen   Barrett's syndrome    Gastric polyp    GERD (gastroesophageal reflux disease)    Heart murmur    Hiatal hernia    HNP (herniated nucleus pulposus with myelopathy), thoracic    Lumbar disc disease    Lumbar spondylosis    PFO (patent foramen ovale)     Past Surgical History:  Procedure Laterality Date   ANTERIOR CERVICAL DECOMP/DISCECTOMY FUSION  12/2020   BUBBLE STUDY  11/01/2018   Procedure: BUBBLE STUDY;  Surgeon: Maranda Leim DEL, MD;  Location: [facility] ENDOSCOPY;  Service: Cardiovascular;;   CESAREAN SECTION  2007, 2009   x 2   COLONOSCOPY     HEMILAMINOTOMY LUMBAR SPINE  09/2003   KNEE ARTHROSCOPY Right    MICRODISCECTOMY LUMBAR     L5-S1   TEE WITHOUT CARDIOVERSION N/A 11/01/2018   Procedure: TRANSESOPHAGEAL ECHOCARDIOGRAM (TEE);  Surgeon: Maranda Leim DEL, MD;  Location: [facility] ENDOSCOPY;  Service: Cardiovascular;  Laterality: N/A;   TUBAL LIGATION     UPPER GASTROINTESTINAL ENDOSCOPY      Current Medications: Current Meds  Medication Sig   aspirin EC 81 MG tablet Take 81 mg by mouth daily.   Azelastine HCl 137 MCG/SPRAY SOLN Place 1 spray into both nostrils 2 (two) times daily.   Biotin 10 MG CAPS Take 10 mg by mouth daily.    cetirizine (ZYRTEC) 10 MG tablet Take 10 mg by mouth daily.   Cholecalciferol (VITAMIN D3) 25 MCG (1000 UT) CAPS Take 1,000 Units by mouth daily.   Glucosamine 500 MG CAPS Glucosamine   Multiple Vitamin (MULTIVITAMIN WITH MINERALS)  TABS Take 1 tablet by mouth daily.   Omega-3 Fatty Acids (FISH OIL PO) Take 4 capsules by mouth daily.   omeprazole (PRILOSEC) 20 MG capsule Take 20 mg by mouth daily.   Probiotic Product (PROBIOTIC PO) Take 1 capsule by mouth daily.   psyllium (REGULOID) 0.52 g capsule Take 0.52 g by mouth daily.     Allergies:   Pollen extract   Social History   Socioeconomic History   Marital status: Widowed    Spouse name: Not on file   Number of children: 2   Years of education: Not on file    Highest education level: Not on file  Occupational History   Occupation: veternarian    Employer: [employer]  Tobacco Use   Smoking status: Never   Smokeless tobacco: Never  Vaping Use   Vaping status: Never Used  Substance and Sexual Activity   Alcohol  use: Yes    Alcohol /week: 1.0 standard drink of alcohol     Types: 1 Glasses of wine per week    Comment: 1-2 per year   Drug use: No   Sexual activity: Not Currently    Partners: Male    Birth control/protection: None  Other Topics Concern   Not on file  Social History Narrative   Not on file   Social Drivers of Health   Financial Resource Strain: Not on file  Food Insecurity: Unknown (03/27/2024)   Hunger Vital Sign    Worried About Running Out of Food in the Last Year: Never true    Ran Out of Food in the Last Year: Not on file  Transportation Needs: Not on file  Physical Activity: Not on file  Stress: Not on file  Social Connections: Unknown (09/15/2021)   Received from [employer]   Social Network    Social Network: Not on file     Family History: The patient's family history includes Heart disease in her father; Hypertension in her father; Leukemia in her mother. There is no history of Colon cancer, Colon polyps, Esophageal cancer, Stomach cancer, Rectal cancer, or Breast cancer.  EKGs/Labs/Other Studies Reviewed:    The following studies were reviewed today:  EKG Interpretation Date/Time:  Tuesday March 27 2024 13:57:07 EST Ventricular Rate:  61 PR Interval:  162 QRS Duration:  100 QT Interval:  408 QTC Calculation: 410 R Axis:   85  Text Interpretation: Normal sinus rhythm Possible Left atrial enlargement Incomplete right bundle branch block Confirmed by Kena Limon [phone]) on 03/27/2024 2:11:37 PM    Recent Labs: No results found for requested labs within last 365 days.  Recent Lipid Panel No results found for: CHOL, TRIG, HDL, CHOLHDL, VLDL, LDLCALC,  LDLDIRECT    Physical Exam:    VS:  BP 122/68 (BP Location: Right Arm)   Pulse 61   Ht 5' 3 (1.6 m)   Wt 144 lb 6.4 oz (65.5 kg)   SpO2 97% Comment: RA  BMI 25.58 kg/m        Wt Readings from Last 3 Encounters:  03/27/24 144 lb 6.4 oz (65.5 kg)  05/18/22 138 lb (62.6 kg)  04/09/22 138 lb (62.6 kg)     GEN:  Well nourished, well developed in no acute distress HEENT: Normal CARDIAC: S1-S2 normal, RRR, 2/6 systolic murmur, right and left sternal borders/2nd IC space, rubs, gallops RESPIRATORY:  Clear to auscultation without rales, wheezing or rhonchi  MUSCULOSKELETAL:  No edema; No deformity  SKIN: Warm and dry NEUROLOGIC:  Alert and oriented x  3 PSYCHIATRIC:  Normal affect       Assessment & Plan Preoperative cardiovascular examination Systolic murmur PFO (patent foramen ovale) According to the Revised Cardiac Risk Index (RCRI), her Perioperative Risk of Major Cardiac Event is (%): 0.4  Her Functional Capacity in METs is: 6.05 according to the Duke Activity Status Index (DASI). Therefore, based on ACC/AHA guidelines, patient would be at acceptable risk for the planned procedure without further cardiovascular testing.  I suspect murmur may be physiologic in nature given her lack of prior valvular abnormalities and the fact that she's very physically fit. Will plan to update echo which does not have to be done prior to surgery, as she is asymptomatic and regularly highly physically active.   Regarding ASA therapy, we recommend continuation of ASA throughout the perioperative period. However, if the surgeon feels that cessation of ASA is required in the perioperative period, it may be stopped 5-7 days prior to surgery with a plan to resume it as soon as felt to be feasible from a surgical standpoint in the post-operative period.   I will route this recommendation to the requesting party via Epic fax function.   Follow up in 3 months to go over echo results, or sooner if  necessary        Medication Adjustments/Labs and Tests Ordered: Current medicines are reviewed at length with the patient today.  Concerns regarding medicines are outlined above.  Orders Placed This Encounter  Procedures   EKG 12-Lead   ECHOCARDIOGRAM COMPLETE   No orders of the defined types were placed in this encounter.   Patient Instructions  Medication Instructions:    Your physician recommends that you continue on your current medications as directed. Please refer to the Current Medication list given to you today.    Testing/Procedures:  Your physician has requested that you have an echocardiogram. Echocardiography is a painless test that uses sound waves to create images of your heart. It provides your doctor with information about the size and shape of your heart and how well your heart's chambers and valves are working. This procedure takes approximately one hour. There are no restrictions for this procedure. Please do NOT wear cologne, perfume, aftershave, or lotions (deodorant is allowed). Please arrive 15 minutes prior to your appointment time.  Please note: We ask at that you not bring children with you during ultrasound (echo/ vascular) testing. Due to room size and safety concerns, children are not allowed in the ultrasound rooms during exams. Our front office staff cannot provide observation of children in our lobby area while testing is being conducted. An adult accompanying a patient to their appointment will only be allowed in the ultrasound room at the discretion of the ultrasound technician under special circumstances. We apologize for any inconvenience.    Follow-Up: Please follow up in 3 months with Dr. Okey or an Extender in the office  We will send your cardiac clearance note for your upcoming procedure, to the requesting Surgeon's office    Signed, Miriam FORBES Shams, NP  03/27/2024 2:44 PM    [facility]

## 2024-03-27 ENCOUNTER — Encounter (HOSPITAL_BASED_OUTPATIENT_CLINIC_OR_DEPARTMENT_OTHER): Payer: Self-pay | Admitting: Emergency Medicine

## 2024-03-27 ENCOUNTER — Ambulatory Visit (HOSPITAL_BASED_OUTPATIENT_CLINIC_OR_DEPARTMENT_OTHER): Admitting: Emergency Medicine

## 2024-03-27 VITALS — BP 122/68 | HR 61 | Ht 63.0 in | Wt 144.4 lb

## 2024-03-27 DIAGNOSIS — R011 Cardiac murmur, unspecified: Secondary | ICD-10-CM

## 2024-03-27 DIAGNOSIS — Z01810 Encounter for preprocedural cardiovascular examination: Secondary | ICD-10-CM | POA: Diagnosis not present

## 2024-03-27 DIAGNOSIS — Q2112 Patent foramen ovale: Secondary | ICD-10-CM

## 2024-03-27 NOTE — Patient Instructions (Addendum)
 Medication Instructions:    Your physician recommends that you continue on your current medications as directed. Please refer to the Current Medication list given to you today.    Testing/Procedures:  Your physician has requested that you have an echocardiogram. Echocardiography is a painless test that uses sound waves to create images of your heart. It provides your doctor with information about the size and shape of your heart and how well your heart's chambers and valves are working. This procedure takes approximately one hour. There are no restrictions for this procedure. Please do NOT wear cologne, perfume, aftershave, or lotions (deodorant is allowed). Please arrive 15 minutes prior to your appointment time.  Please note: We ask at that you not bring children with you during ultrasound (echo/ vascular) testing. Due to room size and safety concerns, children are not allowed in the ultrasound rooms during exams. Our front office staff cannot provide observation of children in our lobby area while testing is being conducted. An adult accompanying a patient to their appointment will only be allowed in the ultrasound room at the discretion of the ultrasound technician under special circumstances. We apologize for any inconvenience.    Follow-Up: Please follow up in 3 months with Dr. Okey or an Extender in the office  We will send your cardiac clearance note for your upcoming procedure, to the requesting Surgeon's office

## 2024-04-02 DIAGNOSIS — J301 Allergic rhinitis due to pollen: Secondary | ICD-10-CM | POA: Diagnosis not present

## 2024-04-02 DIAGNOSIS — J3089 Other allergic rhinitis: Secondary | ICD-10-CM | POA: Diagnosis not present

## 2024-04-02 DIAGNOSIS — J3081 Allergic rhinitis due to animal (cat) (dog) hair and dander: Secondary | ICD-10-CM | POA: Diagnosis not present

## 2024-04-13 ENCOUNTER — Encounter (HOSPITAL_BASED_OUTPATIENT_CLINIC_OR_DEPARTMENT_OTHER): Payer: Self-pay | Admitting: Orthopedic Surgery

## 2024-04-13 ENCOUNTER — Other Ambulatory Visit: Payer: Self-pay

## 2024-04-13 NOTE — Progress Notes (Signed)
°   04/13/24 1509  PAT Phone Screen  Is the patient taking a GLP-1 receptor agonist? No  Do You Have Diabetes? No  Do You Have Hypertension? No  Have You Ever Been to the ER for Asthma? No  Have You Taken Oral Steroids in the Past 3 Months? No  Do you Take Phenteramine or any Other Diet Drugs? No  Recent  Lab Work, EKG, CXR? Yes  Where was this test performed? 03-27-24 [facility]KG[facility]  Do you have a history of heart problems? (S)  Yes (hx PFO 2020)  Cardiologist Name Dr Vina Gull  Have you ever had tests on your heart? Yes  What cardiac tests were performed? Echo  What date/year were cardiac tests completed? 09-16-21 ECHO EF 60-65%, no PFO  Results viewable: [facility] Media Tab  Any Recent Hospitalizations? No  Height 5' 3 (1.6 m)  Weight 65.8 kg  Pat Appointment Scheduled No  Reason for No Appointment Not Needed

## 2024-04-20 ENCOUNTER — Ambulatory Visit (HOSPITAL_BASED_OUTPATIENT_CLINIC_OR_DEPARTMENT_OTHER): Admission: RE | Admit: 2024-04-20 | Source: Home / Self Care | Admitting: Orthopedic Surgery

## 2024-04-20 DIAGNOSIS — Z01818 Encounter for other preprocedural examination: Secondary | ICD-10-CM

## 2024-04-20 SURGERY — ARTHROSCOPY, KNEE, WITH MEDIAL MENISCECTOMY
Anesthesia: Choice | Site: Knee | Laterality: Right

## 2024-05-01 ENCOUNTER — Ambulatory Visit (INDEPENDENT_AMBULATORY_CARE_PROVIDER_SITE_OTHER)

## 2024-05-01 DIAGNOSIS — Z01810 Encounter for preprocedural cardiovascular examination: Secondary | ICD-10-CM

## 2024-05-01 DIAGNOSIS — Q2112 Patent foramen ovale: Secondary | ICD-10-CM | POA: Diagnosis not present

## 2024-05-01 DIAGNOSIS — R011 Cardiac murmur, unspecified: Secondary | ICD-10-CM | POA: Diagnosis not present

## 2024-05-01 LAB — ECHOCARDIOGRAM COMPLETE
Area-P 1/2: 3.99 cm2
S' Lateral: 2.29 cm

## 2024-05-07 DIAGNOSIS — Q2112 Patent foramen ovale: Secondary | ICD-10-CM

## 2024-05-08 NOTE — Telephone Encounter (Signed)
"  ° °  Patient Name: OLAYINKA GATHERS  DOB: [birth year] MRN: [id number]  Primary Cardiologist: Vina Gull, MD  Echo performed 05/07/2024, reviewed by our structural cardiologist.  Chart reviewed as part of pre-operative protocol coverage. Given past medical history and time since last visit, based on ACC/AHA guidelines, Dacy M Stetzer is at acceptable risk for the planned procedure without further cardiovascular testing.   See office visit notes from 03/27/2024 for further information and recommendations.   I will route this recommendation to the requesting party via Epic fax function and remove from pre-op pool.  Please call with questions.  Davyn Elsasser E Nathaniel Yaden, NP 05/08/2024, 4:21 PM  "

## 2024-05-08 NOTE — Addendum Note (Signed)
 Addended by: Loren Vicens E on: 05/08/2024 05:00 PM   Modules accepted: Orders

## 2024-05-08 NOTE — Addendum Note (Signed)
 Addended by: Keiandre Cygan E on: 05/08/2024 04:51 PM   Modules accepted: Orders

## 2024-05-09 ENCOUNTER — Other Ambulatory Visit: Payer: Self-pay

## 2024-05-09 DIAGNOSIS — Q2112 Patent foramen ovale: Secondary | ICD-10-CM

## 2024-05-09 LAB — HEMOGLOBIN AND HEMATOCRIT, BLOOD
Hematocrit: 43.3 % (ref 34.0–46.6)
Hemoglobin: 13.8 g/dL (ref 11.1–15.9)

## 2024-05-10 ENCOUNTER — Ambulatory Visit: Payer: Self-pay | Admitting: Emergency Medicine

## 2024-05-11 NOTE — Progress Notes (Signed)
Patient has been notified directly; all questions, if any, were answered. Patient voiced understanding.   

## 2024-05-21 ENCOUNTER — Encounter (HOSPITAL_COMMUNITY): Payer: Self-pay

## 2024-05-22 ENCOUNTER — Ambulatory Visit (HOSPITAL_COMMUNITY)
Admission: RE | Admit: 2024-05-22 | Discharge: 2024-05-22 | Disposition: A | Discharge status: Home / Self Care | Source: Ambulatory Visit | Attending: Emergency Medicine | Admitting: Emergency Medicine

## 2024-05-22 ENCOUNTER — Other Ambulatory Visit (HOSPITAL_BASED_OUTPATIENT_CLINIC_OR_DEPARTMENT_OTHER): Payer: Self-pay | Admitting: Emergency Medicine

## 2024-05-22 DIAGNOSIS — Q2112 Patent foramen ovale: Secondary | ICD-10-CM

## 2024-05-22 MED ORDER — GADOBUTROL 1 MMOL/ML IV SOLN
9.5000 mL | Freq: Once | INTRAVENOUS | Status: AC | PRN
  Administered 2024-05-22: 9.5 mL via INTRAVENOUS

## 2024-05-24 ENCOUNTER — Ambulatory Visit: Payer: Self-pay | Admitting: Emergency Medicine

## 2024-06-13 ENCOUNTER — Ambulatory Visit: Admitting: Internal Medicine
